# Patient Record
Sex: Male | Born: 1990 | Race: White | Hispanic: No | Marital: Married | Smoking: Never smoker
Health system: Southern US, Community
[De-identification: ages and names within clinical notes are randomized; demographics above are authoritative.]

## PROBLEM LIST (undated history)

## (undated) HISTORY — PX: APPENDECTOMY: SHX54

---

## 2017-06-02 ENCOUNTER — Encounter: Payer: Self-pay | Admitting: Physician Assistant

## 2017-06-02 ENCOUNTER — Encounter (INDEPENDENT_AMBULATORY_CARE_PROVIDER_SITE_OTHER): Payer: Self-pay

## 2017-06-02 ENCOUNTER — Ambulatory Visit: Payer: Commercial Managed Care - PPO | Admitting: Physician Assistant

## 2017-06-02 VITALS — BP 128/65 | HR 79 | Ht 68.5 in | Wt 195.0 lb

## 2017-06-02 DIAGNOSIS — Z3009 Encounter for other general counseling and advice on contraception: Secondary | ICD-10-CM | POA: Diagnosis not present

## 2017-06-02 DIAGNOSIS — F900 Attention-deficit hyperactivity disorder, predominantly inattentive type: Secondary | ICD-10-CM

## 2017-06-02 MED ORDER — AMPHETAMINE-DEXTROAMPHET ER 20 MG PO CP24: 20.0000 mg | ORAL_CAPSULE | ORAL | 0 refills | Status: DC

## 2017-06-02 NOTE — Patient Instructions (Signed)
Vasectomy  A vasectomy is tying (with or without cutting) the tube that collects the sperm from the testicle (vas deferens). The vasectomy blocks the sperm from going through the vas deferens and penis so that during sexual intercourse, the sperm does not go into the vagina. Vasectomy is safe, with very rare complications. It does not affect your sexual desire or performance. A vasectomy does not prevent sexually transmitted diseases.  Because vasectomy is considered permanent, you should not have it done until you are sure you do not want any more children. You and your partner should be in full agreement to have the procedure. Your decision to have a vasectomy should not be made during a stressful situation. This includes loss of a pregnancy, illness, death of a spouse, or divorce. There are other means of contraception that can be used until you are completely sure you want this procedure done.  Tell a health care provider about:  · Any allergies you have.  · All medicines you are taking, including vitamins, herbs, eye drops, creams, and over-the-counter medicines.  · Any problems you or family members have had with anesthetic medicines.  · Any blood disorders you have.  · Any surgeries you have had.  · Any medical conditions you have.  What are the risks?  Generally, vasectomy is a safe procedure. However, as with any procedure, complications can occur. Possible complications include:  · Failure of the procedure to cause infertility. This means you would still be able to get a male pregnant. Even after sterilization has been achieved, there is a 1 in 10,000 chance that the two cut ends may reconnect (recanalization).  · Infection. A germ starts growing in the wound. This can usually be treated with antibiotic medicine(s).  · An allergic reaction to the anesthetic or other medicine given.  · Bleeding. Blood may seep under the skin so that the scrotum and penis  appear to be bruised. Sometimes the scrotum can swell and get the size of a grapefruit. This usually disappears without treatment within a week or two.    What happens before the procedure?  · Do not take aspirin or aspirin-containing products for 7 days prior to your procedure.  · Do not take nonsteroidal anti-inflammatory products for 7 days prior to your procedure.  · You may be instructed to wash with soap before coming in for your procedure.  What happens during the procedure?  · The scrotum is cleaned with bacteria-killing soap, and the health care provider finds the vas deferens.  · Each side of the scrotum is numbed.  · A very small cut (incision) is made, and the vas deferens are pulled out of the scrotum. The vas deferens are then tied off, cut, or may be burned (cauterized) at the ends.  · Sometimes the vas deferens are pulled out from the scrotum through a puncture wound. This is done with a special instrument without an incision.  · The vas deferens are then put back into the scrotum, and the incision or puncture wound is closed. Absorbable suture material that will dissolve and not need to be removed is commonly used.  · After surgery, sperm may still be left in the vas deferens for 1–3 months. Because of this, other means of contraception should be used until your health care provider examines you and finds there are no sperm in your seminal fluid.  What happens after the procedure?  After the procedure, you will be taken to the recovery area. Your   progress will be watched and checked. Once you are awake, stable, and taking fluids well, you will be allowed to go home as long as there are no problems.  This information is not intended to replace advice given to you by your health care provider. Make sure you discuss any questions you have with your health care provider.  Document Released: 08/03/2002 Document Revised: 10/19/2015 Document Reviewed: 11/30/2012   Elsevier Interactive Patient Education © 2018 [employer]

## 2017-06-02 NOTE — Progress Notes (Signed)
   Subjective:    Patient ID: Michael Booker, male    DOB: 01/12/1991, 27 y.o.   MRN: [id number]  HPI  Patient is a 27 year old pleasant male who presents to the clinic to establish care.  He does have a past medical history of attention deficit disorder.  He was on Ritalin and Concerta as a child in middle school and high school.  He switched to Adderall in college.  He has not been on medication since college.  He has seemed always managed to find a way around his issue as an adult.  He now has a more stressful job which requires more concentration.  He was recently started on Wellbutrin to see if that would help with his difficulty concentrating.  That does not seem to help at all.  He also feels like it seems to make his mood change.  He would like to restart a stimulant.  Patient also has 2 children and he would like to have a referral for vasectomy.  .. Active Ambulatory Problems    Diagnosis Date Noted  . No Active Ambulatory Problems   Resolved Ambulatory Problems    Diagnosis Date Noted  . No Resolved Ambulatory Problems   No Additional Past Medical History      Review of Systems  All other systems reviewed and are negative.      Objective:   Physical Exam  Constitutional: He is oriented to person, place, and time. He appears well-developed and well-nourished.  HENT:  Head: Normocephalic and atraumatic.  Eyes: Conjunctivae are normal.  Neck: Normal range of motion. Neck supple.  Cardiovascular: Normal rate, regular rhythm and normal heart sounds.  Pulmonary/Chest: Effort normal and breath sounds normal.  Neurological: He is alert and oriented to person, place, and time.  Psychiatric: He has a normal mood and affect. His behavior is normal.          Assessment & Plan:  [name].[name].Diagnoses and all orders for this visit:  Attention deficit hyperactivity disorder (ADHD), predominantly inattentive type -     amphetamine-dextroamphetamine (ADDERALL XR) 20 MG 24 hr capsule;  Take 1 capsule (20 mg total) by mouth every morning.  Vasectomy evaluation -     Ambulatory referral to Urology   Restarted adderall. Discussed side effects. Follow up in 1 month.  Once we get him on appropriate dose can follow up every 3 months.   Discussed need for fasting labs.

## 2017-07-07 ENCOUNTER — Encounter: Payer: Self-pay | Admitting: Physician Assistant

## 2017-07-07 ENCOUNTER — Ambulatory Visit: Payer: Commercial Managed Care - PPO | Admitting: Physician Assistant

## 2017-07-07 VITALS — BP 129/78 | HR 68 | Ht 68.5 in | Wt 197.0 lb

## 2017-07-07 DIAGNOSIS — F900 Attention-deficit hyperactivity disorder, predominantly inattentive type: Secondary | ICD-10-CM

## 2017-07-07 DIAGNOSIS — R040 Epistaxis: Secondary | ICD-10-CM | POA: Diagnosis not present

## 2017-07-07 MED ORDER — AMPHETAMINE-DEXTROAMPHET ER 30 MG PO CP24: 30.0000 mg | ORAL_CAPSULE | ORAL | 0 refills | Status: DC

## 2017-07-07 MED ORDER — AMPHETAMINE-DEXTROAMPHETAMINE 10 MG PO TABS: ORAL_TABLET | ORAL | 0 refills | Status: DC

## 2017-07-07 NOTE — Progress Notes (Signed)
   Subjective:    Patient ID: Michael Booker, male    DOB: 04/20/1991, 27 y.o.   MRN: [id number]  HPI  Patient is a 27 year old male who presents to the clinic to follow-up on ADHD.  He was recently started on Adderall 20 mg extended release.  He does feel like this has been beneficial but would like to increase.  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  He denies any problems with sleeping, any increase in anxiety, or any problems with eating.  When I did walk into the room he was nursing a left nare nosebleed.  He denies having these often.  Bleeding was well controlled.  No other concerns.  .. Active Ambulatory Problems    Diagnosis Date Noted  . Attention deficit hyperactivity disorder (ADHD), predominantly inattentive type 06/02/2017   Resolved Ambulatory Problems    Diagnosis Date Noted  . No Resolved Ambulatory Problems   No Additional Past Medical History      Review of Systems  All other systems reviewed and are negative.      Objective:   Physical Exam  Constitutional: He is oriented to person, place, and time. He appears well-developed and well-nourished.  HENT:  Head: Normocephalic and atraumatic.  Right Ear: External ear normal.  Left Ear: External ear normal.  Dried blood left nare.  Cardiovascular: Normal rate, regular rhythm and normal heart sounds.  Pulmonary/Chest: Effort normal and breath sounds normal.  Neurological: He is alert and oriented to person, place, and time.  Psychiatric: He has a normal mood and affect. His behavior is normal.          Assessment & Plan:   [name].[name].Aurther Lofterry was seen today for adhd.  Diagnoses and all orders for this visit:  Attention deficit hyperactivity disorder (ADHD), predominantly inattentive type -     amphetamine-dextroamphetamine (ADDERALL XR) 30 MG 24 hr capsule; Take 1 capsule (30 mg total) by mouth every morning. -     amphetamine-dextroamphetamine  (ADDERALL) 10 MG tablet; Take one tablet at 2pm.  Left-sided nosebleed  Other orders -     Discontinue: amphetamine-dextroamphetamine (ADDERALL XR) 30 MG 24 hr capsule; Take 1 capsule (30 mg total) by mouth every morning. -     Discontinue: amphetamine-dextroamphetamine (ADDERALL) 10 MG tablet; Take one tablet at 2pm. -     Discontinue: amphetamine-dextroamphetamine (ADDERALL XR) 30 MG 24 hr capsule; Take 1 capsule (30 mg total) by mouth every morning. -     Discontinue: amphetamine-dextroamphetamine (ADDERALL) 10 MG tablet; Take one tablet at 2pm.     Increased adderall to 30mg  XR added 10mg  immediate release. Follow up in 3 months. Vitals look great. No side effects.   Discussed humidifer in bedroom to sleep with. Ayr nasal gel can help prevent as well. Avoid nasal sprays.  Afrin only if can't get bleeding to stop.

## 2017-09-01 ENCOUNTER — Ambulatory Visit: Payer: Commercial Managed Care - PPO | Admitting: Physician Assistant

## 2017-09-03 ENCOUNTER — Encounter: Payer: Self-pay | Admitting: Physician Assistant

## 2017-09-03 ENCOUNTER — Ambulatory Visit: Payer: Commercial Managed Care - PPO | Admitting: Physician Assistant

## 2017-09-03 VITALS — BP 116/65 | HR 77 | Ht 68.5 in | Wt 188.0 lb

## 2017-09-03 DIAGNOSIS — F900 Attention-deficit hyperactivity disorder, predominantly inattentive type: Secondary | ICD-10-CM

## 2017-09-03 MED ORDER — AMPHETAMINE-DEXTROAMPHETAMINE 10 MG PO TABS: 10.0000 mg | ORAL_TABLET | Freq: Two times a day (BID) | ORAL | 0 refills | Status: DC

## 2017-09-03 MED ORDER — AMPHETAMINE-DEXTROAMPHET ER 30 MG PO CP24: 30.0000 mg | ORAL_CAPSULE | ORAL | 0 refills | Status: DC

## 2017-09-03 MED ORDER — AMPHETAMINE-DEXTROAMPHETAMINE 10 MG PO TABS
ORAL_TABLET | ORAL | 0 refills | Status: DC
Start: 2017-09-03 — End: 2017-12-03

## 2017-09-03 NOTE — Progress Notes (Deleted)
   Subjective:    Patient ID: Michael Booker, male    DOB: 03/24/1991, 27 y.o.   MRN: [id number]  HPI  6   Review of Systems     Objective:   Physical Exam        Assessment & Plan:    [name].[name].Michael Booker was seen today for adhd.  Diagnoses and all orders for this visit:  Attention deficit hyperactivity disorder (ADHD), predominantly inattentive type -     amphetamine-dextroamphetamine (ADDERALL XR) 30 MG 24 hr capsule; Take 1 capsule (30 mg total) by mouth every morning. -     amphetamine-dextroamphetamine (ADDERALL) 10 MG tablet; Take one tablet at 2pm.  Other orders -     amphetamine-dextroamphetamine (ADDERALL) 10 MG tablet; Take 1 tablet (10 mg total) by mouth 2 (two) times daily. -     amphetamine-dextroamphetamine (ADDERALL XR) 30 MG 24 hr capsule; Take 1 capsule (30 mg total) by mouth every morning. -     amphetamine-dextroamphetamine (ADDERALL) 10 MG tablet; Take 1 tablet (10 mg total) by mouth 2 (two) times daily. -     amphetamine-dextroamphetamine (ADDERALL XR) 30 MG 24 hr capsule; Take 1 capsule (30 mg total) by mouth every morning.

## 2017-09-03 NOTE — Progress Notes (Signed)
   Subjective:    Patient ID: Michael Booker, male    DOB: 03/09/1991, 27 y.o.   MRN: [id number]  HPI Michael Booker presents today for ADHD medication refill. He states that he is going well on his medication and is not requesting any changes. He denies any insomnia, appetite changes, chest pain, palpitations, headaches. He does state that sometimes he has been going to sleep a little later but is able to sleep. He has started taking his afternoon dose of adderall a little earlier than he was originally.  .. Active Ambulatory Problems    Diagnosis Date Noted  . Attention deficit hyperactivity disorder (ADHD), predominantly inattentive type 06/02/2017   Resolved Ambulatory Problems    Diagnosis Date Noted  . No Resolved Ambulatory Problems   No Additional Past Medical History      Review of Systems  Constitutional: Negative for activity change and appetite change.  Respiratory: Negative for cough, chest tightness and shortness of breath.   Cardiovascular: Negative for chest pain and palpitations.  Gastrointestinal: Negative for abdominal pain and vomiting.  Neurological: Negative for dizziness, light-headedness and headaches.  Psychiatric/Behavioral: Positive for sleep disturbance.       Objective:   Physical Exam  Constitutional: He is oriented to person, place, and time. He appears well-developed and well-nourished.  HENT:  Head: Normocephalic and atraumatic.  Eyes: Conjunctivae are normal.  Neck: Normal range of motion. Neck supple.  Cardiovascular: Normal rate, regular rhythm and normal heart sounds.  Pulmonary/Chest: Breath sounds normal. No respiratory distress. He has no wheezes.  Musculoskeletal: Normal range of motion.  Neurological: He is alert and oriented to person, place, and time.  Skin: Skin is warm and dry.  Psychiatric: He has a normal mood and affect. His behavior is normal.          Assessment & Plan:  [name].[name].Michael Booker was seen today for adhd.  Diagnoses and all  orders for this visit:  Attention deficit hyperactivity disorder (ADHD), predominantly inattentive type -     amphetamine-dextroamphetamine (ADDERALL XR) 30 MG 24 hr capsule; Take 1 capsule (30 mg total) by mouth every morning. -     amphetamine-dextroamphetamine (ADDERALL) 10 MG tablet; Take one tablet at 2pm. -     amphetamine-dextroamphetamine (ADDERALL) 10 MG tablet; Take 1 tablet (10 mg total) by mouth 2 (two) times daily. -     amphetamine-dextroamphetamine (ADDERALL XR) 30 MG 24 hr capsule; Take 1 capsule (30 mg total) by mouth every morning. -     amphetamine-dextroamphetamine (ADDERALL) 10 MG tablet; Take 1 tablet (10 mg total) by mouth 2 (two) times daily. -     amphetamine-dextroamphetamine (ADDERALL XR) 30 MG 24 hr capsule; Take 1 capsule (30 mg total) by mouth every morning.   Refills sent for 90 days. Vitals look great. Functioning well. I did discuss sleeping troubles and how stimulants can exacerbate this. Will continue to monitor. Make sure not taking afternoon dose too late.

## 2017-10-06 ENCOUNTER — Ambulatory Visit: Payer: Commercial Managed Care - PPO | Admitting: Physician Assistant

## 2017-12-03 ENCOUNTER — Ambulatory Visit: Payer: Commercial Managed Care - PPO | Admitting: Physician Assistant

## 2017-12-03 ENCOUNTER — Encounter: Payer: Self-pay | Admitting: Physician Assistant

## 2017-12-03 DIAGNOSIS — F900 Attention-deficit hyperactivity disorder, predominantly inattentive type: Secondary | ICD-10-CM

## 2017-12-03 MED ORDER — AMPHETAMINE-DEXTROAMPHETAMINE 10 MG PO TABS: ORAL_TABLET | ORAL | 0 refills | Status: DC

## 2017-12-03 MED ORDER — AMPHETAMINE-DEXTROAMPHET ER 30 MG PO CP24
30.0000 mg | ORAL_CAPSULE | ORAL | 0 refills | Status: DC
Start: 2018-01-03 — End: 2018-03-25

## 2017-12-03 MED ORDER — AMPHETAMINE-DEXTROAMPHET ER 30 MG PO CP24: 30.0000 mg | ORAL_CAPSULE | ORAL | 0 refills | Status: DC

## 2017-12-03 NOTE — Progress Notes (Signed)
   Subjective:    Patient ID: Michael Booker, male    DOB: 04/21/1991, 27 y.o.   MRN: [id number]  HPI Pt is a 27 yo male with ADHD who presents to the clinic for 3 month follow up.  Pt is doing great. He has no complaints. He denies any trouble sleeping, increase in anxiety, palpitations or CP. He is doing well at work. He does not take every day. He actually does not need a refill today.   .. Active Ambulatory Problems    Diagnosis Date Noted  . Attention deficit hyperactivity disorder (ADHD), predominantly inattentive type 06/02/2017   Resolved Ambulatory Problems    Diagnosis Date Noted  . No Resolved Ambulatory Problems   No Additional Past Medical History      Review of Systems  All other systems reviewed and are negative.      Objective:   Physical Exam  Constitutional: He is oriented to person, place, and time. He appears well-developed and well-nourished.  Cardiovascular: Normal rate and regular rhythm.  Pulmonary/Chest: Effort normal and breath sounds normal.  Neurological: He is alert and oriented to person, place, and time.  Psychiatric: He has a normal mood and affect. His behavior is normal.          Assessment & Plan:  [name].[name].Diagnoses and all orders for this visit:  Attention deficit hyperactivity disorder (ADHD), predominantly inattentive type -     amphetamine-dextroamphetamine (ADDERALL XR) 30 MG 24 hr capsule; Take 1 capsule (30 mg total) by mouth every morning. -     amphetamine-dextroamphetamine (ADDERALL) 10 MG tablet; Take one tablet at 2pm as needed. -     amphetamine-dextroamphetamine (ADDERALL) 10 MG tablet; Take one tablet at 2pm as needed. -     amphetamine-dextroamphetamine (ADDERALL) 10 MG tablet; Take one tablet at 2pm as needed. -     amphetamine-dextroamphetamine (ADDERALL XR) 30 MG 24 hr capsule; Take 1 capsule (30 mg total) by mouth every morning. -     amphetamine-dextroamphetamine (ADDERALL XR) 30 MG 24 hr capsule; Take 1 capsule (30 mg  total) by mouth every morning.   Refilled for 3 months.

## 2018-03-25 ENCOUNTER — Ambulatory Visit: Payer: Commercial Managed Care - PPO | Admitting: Physician Assistant

## 2018-03-25 ENCOUNTER — Encounter: Payer: Self-pay | Admitting: Physician Assistant

## 2018-03-25 VITALS — BP 127/80 | HR 84 | Ht 68.5 in | Wt 196.0 lb

## 2018-03-25 DIAGNOSIS — F419 Anxiety disorder, unspecified: Secondary | ICD-10-CM | POA: Diagnosis not present

## 2018-03-25 DIAGNOSIS — F900 Attention-deficit hyperactivity disorder, predominantly inattentive type: Secondary | ICD-10-CM | POA: Diagnosis not present

## 2018-03-25 MED ORDER — ESCITALOPRAM OXALATE 5 MG PO TABS: 5.0000 mg | ORAL_TABLET | Freq: Every day | ORAL | 0 refills | Status: DC

## 2018-03-25 MED ORDER — AMPHETAMINE-DEXTROAMPHETAMINE 10 MG PO TABS: ORAL_TABLET | ORAL | 0 refills | Status: DC

## 2018-03-25 MED ORDER — AMPHETAMINE-DEXTROAMPHET ER 30 MG PO CP24: 30.0000 mg | ORAL_CAPSULE | ORAL | 0 refills | Status: DC

## 2018-03-25 NOTE — Progress Notes (Signed)
Subjective:    Patient ID: Michael Booker, male    DOB: Mar 07, 1991, 27 y.o.   MRN: [id number]  HPI Patient is a 27 year old male who presents to the clinic for ADHD follow-up.  He is doing fairly well on current dose.  He denies any problems sleeping, palpitations, chest pain, headaches or vision changes.  He is doing well at work and able to stay focused.  He is very productive.  He does report some persistent anxiety.  He denies any feelings of hopelessness or helplessness.  He just feels like he worries a lot about different things.  He also feels like he could be easily irritated.  He denies any suicidal thoughts or homicidal lobulations. He is interested in trying something. He denies any panic attacks.   .. Active Ambulatory Problems    Diagnosis Date Noted  . Attention deficit hyperactivity disorder (ADHD), predominantly inattentive type 06/02/2017   Resolved Ambulatory Problems    Diagnosis Date Noted  . No Resolved Ambulatory Problems   No Additional Past Medical History       Review of Systems See HPI>     Objective:   Physical Exam  Constitutional: He is oriented to person, place, and time. He appears well-developed and well-nourished.  HENT:  Head: Normocephalic and atraumatic.  Cardiovascular: Normal rate and regular rhythm.  Pulmonary/Chest: Effort normal and breath sounds normal.  Neurological: He is alert and oriented to person, place, and time.  Psychiatric: He has a normal mood and affect. His behavior is normal.          Assessment & Plan:  [name] KitchenMarland KitchenDiagnoses and all orders for this visit:  Attention deficit hyperactivity disorder (ADHD), predominantly inattentive type -     amphetamine-dextroamphetamine (ADDERALL XR) 30 MG 24 hr capsule; Take 1 capsule (30 mg total) by mouth every morning. -     amphetamine-dextroamphetamine (ADDERALL XR) 30 MG 24 hr capsule; Take 1 capsule (30 mg total) by mouth every morning. -     amphetamine-dextroamphetamine (ADDERALL  XR) 30 MG 24 hr capsule; Take 1 capsule (30 mg total) by mouth every morning. -     amphetamine-dextroamphetamine (ADDERALL) 10 MG tablet; Take one tablet at 2pm as needed. -     amphetamine-dextroamphetamine (ADDERALL) 10 MG tablet; Take one tablet at 2pm as needed. -     amphetamine-dextroamphetamine (ADDERALL) 10 MG tablet; Take one tablet at 2pm as needed.  Anxiety -     escitalopram (LEXAPRO) 5 MG tablet; Take 1 tablet (5 mg total) by mouth daily.   .. GAD 7 : Generalized Anxiety Score 03/25/2018 12/03/2017  Nervous, Anxious, on Edge 1 0  Control/stop worrying 1 0  Worry too much - different things 1 1  Trouble relaxing 1 1  Restless 1 0  Easily annoyed or irritable 2 1  Afraid - awful might happen 1 0  Total GAD 7 Score 8 3  Anxiety Difficulty Somewhat difficult Not difficult at all    .[name] Depression screen [facility] 2/9 03/25/2018 12/03/2017 06/02/2017  Decreased Interest 1 1 2   Down, Depressed, Hopeless 0 1 1  PHQ - 2 Score 1 2 3   Altered sleeping 0 1 1  Tired, decreased energy 0 1 1  Change in appetite 0 1 1  Feeling bad or failure about yourself  0 0 1  Trouble concentrating 0 1 1  Moving slowly or fidgety/restless 0 0 0  Suicidal thoughts 0 0 0  PHQ-9 Score 1 6 8   Difficult doing work/chores Not difficult  at all Not difficult at all -   Refilled adderall for  3months.   Start lexapro 5mg  daily. Discussed side effects. Stop if worsening depression or anxiety. Follow up in 3 months.

## 2018-06-23 ENCOUNTER — Encounter: Payer: Self-pay | Admitting: Physician Assistant

## 2018-06-23 ENCOUNTER — Ambulatory Visit (INDEPENDENT_AMBULATORY_CARE_PROVIDER_SITE_OTHER): Payer: Commercial Managed Care - PPO | Admitting: Physician Assistant

## 2018-06-23 DIAGNOSIS — F900 Attention-deficit hyperactivity disorder, predominantly inattentive type: Secondary | ICD-10-CM

## 2018-06-23 MED ORDER — AMPHETAMINE-DEXTROAMPHETAMINE 10 MG PO TABS: ORAL_TABLET | ORAL | 0 refills | Status: DC

## 2018-06-23 MED ORDER — AMPHETAMINE-DEXTROAMPHET ER 30 MG PO CP24: 30.0000 mg | ORAL_CAPSULE | ORAL | 0 refills | Status: DC

## 2018-06-23 NOTE — Progress Notes (Signed)
Acute Office Visit  Subjective:    Patient ID: Michael Booker, male    DOB: [birth year], 28 y.o.   MRN: [id number]  Chief Complaint  Patient presents with  . Medication Refill    HPI Patient is in today for ADHD medication refill.  Patient is doing well.  Is well controlled on his current dosage.  He denies any problems sleeping or any increase in anxiety or palpitations.  No problems or concerns.  He did stop taking his Lexapro.  He just did not like the way he felt on it.  Denies any one symptom just did not like the way he felt in his head.  Overall he feels like his mood is well controlled.  No past medical history on file.  Past Surgical History:  Procedure Laterality Date  . APPENDECTOMY      No family history on file.  Social History   Socioeconomic History  . Marital status: Married    Spouse name: Not on file  . Number of children: Not on file  . Years of education: Not on file  . Highest education level: Not on file  Occupational History  . Not on file  Social Needs  . Financial resource strain: Not on file  . Food insecurity:    Worry: Not on file    Inability: Not on file  . Transportation needs:    Medical: Not on file    Non-medical: Not on file  Tobacco Use  . Smoking status: Never Smoker  . Smokeless tobacco: Never Used  Substance and Sexual Activity  . Alcohol use: Not on file  . Drug use: No  . Sexual activity: Yes  Lifestyle  . Physical activity:    Days per week: Not on file    Minutes per session: Not on file  . Stress: Not on file  Relationships  . Social connections:    Talks on phone: Not on file    Gets together: Not on file    Attends religious service: Not on file    Active member of club or organization: Not on file    Attends meetings of clubs or organizations: Not on file    Relationship status: Not on file  . Intimate partner violence:    Fear of current or ex partner: Not on file    Emotionally abused: Not on file   Physically abused: Not on file    Forced sexual activity: Not on file  Other Topics Concern  . Not on file  Social History Narrative  . Not on file    Outpatient Medications Prior to Visit  Medication Sig Dispense Refill  . amphetamine-dextroamphetamine (ADDERALL XR) 30 MG 24 hr capsule Take 1 capsule (30 mg total) by mouth every morning. 30 capsule 0  . amphetamine-dextroamphetamine (ADDERALL XR) 30 MG 24 hr capsule Take 1 capsule (30 mg total) by mouth every morning. 30 capsule 0  . amphetamine-dextroamphetamine (ADDERALL XR) 30 MG 24 hr capsule Take 1 capsule (30 mg total) by mouth every morning. 30 capsule 0  . amphetamine-dextroamphetamine (ADDERALL) 10 MG tablet Take one tablet at 2pm as needed. 30 tablet 0  . amphetamine-dextroamphetamine (ADDERALL) 10 MG tablet Take one tablet at 2pm as needed. 30 tablet 0  . amphetamine-dextroamphetamine (ADDERALL) 10 MG tablet Take one tablet at 2pm as needed. 30 tablet 0  . escitalopram (LEXAPRO) 5 MG tablet Take 1 tablet (5 mg total) by mouth daily. (Patient not taking: Reported on 06/23/2018) 30 tablet 0  No facility-administered medications prior to visit.     No Known Allergies  Review of Systems  All other systems reviewed and are negative.      Objective:    Physical Exam  Constitutional: He is oriented to person, place, and time. He appears well-developed and well-nourished.  HENT:  Head: Normocephalic and atraumatic.  Cardiovascular: Normal rate and regular rhythm.  Pulmonary/Chest: Effort normal and breath sounds normal.  Neurological: He is alert and oriented to person, place, and time.  Psychiatric: He has a normal mood and affect. His behavior is normal.    BP 122/75   Pulse 91   Ht 5' 8.5" (1.74 m)   Wt 200 lb (90.7 kg)   BMI 29.97 kg/m  Wt Readings from Last 3 Encounters:  06/23/18 200 lb (90.7 kg)  03/25/18 196 lb (88.9 kg)  12/03/17 194 lb (88 kg)    There are no preventive care reminders to display for  this patient.  There are no preventive care reminders to display for this patient.   No results found for: TSH No results found for: WBC, HGB, HCT, MCV, PLT No results found for: NA, K, CHLORIDE, CO2, GLUCOSE, BUN, CREATININE, BILITOT, ALKPHOS, AST, ALT, PROT, ALBUMIN, CALCIUM, ANIONGAP, EGFR, GFR No results found for: CHOL No results found for: HDL No results found for: LDLCALC No results found for: TRIG No results found for: CHOLHDL No results found for: HGBA1C     Assessment & Plan:   Problem List Items Addressed This Visit      Unprioritized   Attention deficit hyperactivity disorder (ADHD), predominantly inattentive type   Relevant Medications   amphetamine-dextroamphetamine (ADDERALL XR) 30 MG 24 hr capsule (Start on 08/22/2018)   amphetamine-dextroamphetamine (ADDERALL XR) 30 MG 24 hr capsule (Start on 07/24/2018)   amphetamine-dextroamphetamine (ADDERALL XR) 30 MG 24 hr capsule   amphetamine-dextroamphetamine (ADDERALL) 10 MG tablet (Start on 08/22/2018)   amphetamine-dextroamphetamine (ADDERALL) 10 MG tablet (Start on 07/24/2018)   amphetamine-dextroamphetamine (ADDERALL) 10 MG tablet       Meds ordered this encounter  Medications  . amphetamine-dextroamphetamine (ADDERALL XR) 30 MG 24 hr capsule    Sig: Take 1 capsule (30 mg total) by mouth every morning.    Dispense:  30 capsule    Refill:  0    Order Specific Question:   Supervising Provider    Answer:   Beatrice Lecher D [[id number]]  . amphetamine-dextroamphetamine (ADDERALL XR) 30 MG 24 hr capsule    Sig: Take 1 capsule (30 mg total) by mouth every morning.    Dispense:  30 capsule    Refill:  0    Order Specific Question:   Supervising Provider    Answer:   Beatrice Lecher D [[id number]]  . amphetamine-dextroamphetamine (ADDERALL XR) 30 MG 24 hr capsule    Sig: Take 1 capsule (30 mg total) by mouth every morning.    Dispense:  30 capsule    Refill:  0    Order Specific Question:   Supervising Provider     Answer:   Beatrice Lecher D [[id number]]  . amphetamine-dextroamphetamine (ADDERALL) 10 MG tablet    Sig: Take one tablet at 2pm as needed.    Dispense:  30 tablet    Refill:  0    Order Specific Question:   Supervising Provider    Answer:   Beatrice Lecher D [[id number]]  . amphetamine-dextroamphetamine (ADDERALL) 10 MG tablet    Sig: Take one tablet at 2pm as needed.    Dispense:  30 tablet    Refill:  0    Order Specific Question:   Supervising Provider    Answer:   Beatrice Lecher D [[id number]]  . amphetamine-dextroamphetamine (ADDERALL) 10 MG tablet    Sig: Take one tablet at 2pm as needed.    Dispense:  30 tablet    Refill:  0    Order Specific Question:   Supervising Provider    Answer:   Beatrice Lecher D [[id number]]    Follow up in 3 months.  Iran Planas, PA-C

## 2018-06-24 ENCOUNTER — Ambulatory Visit: Payer: Commercial Managed Care - PPO | Admitting: Physician Assistant

## 2018-07-06 ENCOUNTER — Telehealth: Payer: Self-pay | Admitting: Physician Assistant

## 2018-07-06 NOTE — Telephone Encounter (Signed)
Pt called and left message that he needed someone to contact pharmacy, Attempted to call patient in regards to what was needed, no answer , and no VM available.

## 2018-07-06 NOTE — Telephone Encounter (Signed)
Patient is aware that the tablets are approved and we are waiting on insurance to respond about the capsules.   Received fax from [employer] that Adderall Capsules requires a PA. Information has been sent to the insurance company. Awaiting determination.

## 2018-07-06 NOTE — Telephone Encounter (Signed)
Approvedtoday (Adderall Tablets) Request Reference Number: [id number]. AMPHET/DEXTR TAB 10MG  is approved through 07/07/2019. For further questions, call [phone]. Pharmacy aware.

## 2018-07-07 ENCOUNTER — Telehealth: Payer: Self-pay

## 2018-07-07 NOTE — Telephone Encounter (Signed)
Pt should be able to pick up Michael Booker. Please let him know.

## 2018-07-07 NOTE — Telephone Encounter (Signed)
Patient has been notified. Thanks

## 2018-07-07 NOTE — Telephone Encounter (Signed)
See other note. Its approved.  

## 2018-07-07 NOTE — Telephone Encounter (Signed)
Patient called today saying that his insurance company is requiring a PA to be done before he can have his Adderall. Just wanted to let you know. Thanks!

## 2018-07-07 NOTE — Telephone Encounter (Signed)
Received a fax from [employer] that Adderall XR has been approved through 07/07/2019. Pharmacy aware and forms sent to scan.

## 2018-09-22 ENCOUNTER — Ambulatory Visit (INDEPENDENT_AMBULATORY_CARE_PROVIDER_SITE_OTHER): Payer: Commercial Managed Care - PPO | Admitting: Physician Assistant

## 2018-09-22 DIAGNOSIS — Z5329 Procedure and treatment not carried out because of patient's decision for other reasons: Secondary | ICD-10-CM

## 2018-09-23 NOTE — Progress Notes (Signed)
No show

## 2018-09-28 ENCOUNTER — Encounter: Payer: Self-pay | Admitting: Physician Assistant

## 2018-09-28 ENCOUNTER — Ambulatory Visit (INDEPENDENT_AMBULATORY_CARE_PROVIDER_SITE_OTHER): Payer: Commercial Managed Care - PPO | Admitting: Physician Assistant

## 2018-09-28 DIAGNOSIS — F900 Attention-deficit hyperactivity disorder, predominantly inattentive type: Secondary | ICD-10-CM | POA: Diagnosis not present

## 2018-09-28 MED ORDER — AMPHETAMINE-DEXTROAMPHET ER 30 MG PO CP24: 30.0000 mg | ORAL_CAPSULE | ORAL | 0 refills | Status: DC

## 2018-09-28 NOTE — Progress Notes (Signed)
   Subjective:    Patient ID: Michael Booker, male    DOB: Mar 21, 1991, 28 y.o.   MRN: [id number]  HPI Pt is a 28 yo male with ADHD who presents to the clinic for refills. Pt is doing great with no concerns. No CP, palpitations, headaches, anxiety, or insomnia. Work production is great. adderall is working well.   .. Active Ambulatory Problems    Diagnosis Date Noted  . Attention deficit hyperactivity disorder (ADHD), predominantly inattentive type 06/02/2017   Resolved Ambulatory Problems    Diagnosis Date Noted  . No Resolved Ambulatory Problems   No Additional Past Medical History      Review of Systems  All other systems reviewed and are negative.      Objective:   Physical Exam Vitals signs reviewed.  Constitutional:      Appearance: Normal appearance.  HENT:     Head: Normocephalic.  Cardiovascular:     Rate and Rhythm: Normal rate and regular rhythm.     Pulses: Normal pulses.  Pulmonary:     Effort: Pulmonary effort is normal.  Neurological:     General: No focal deficit present.     Mental Status: He is alert and oriented to person, place, and time.  Psychiatric:        Mood and Affect: Mood normal.           Assessment & Plan:  [name] KitchenMarland KitchenTerin was seen today for adhd.  Diagnoses and all orders for this visit:  Attention deficit hyperactivity disorder (ADHD), predominantly inattentive type -     amphetamine-dextroamphetamine (ADDERALL XR) 30 MG 24 hr capsule; Take 1 capsule (30 mg total) by mouth every morning. -     amphetamine-dextroamphetamine (ADDERALL XR) 30 MG 24 hr capsule; Take 1 capsule (30 mg total) by mouth every morning. -     amphetamine-dextroamphetamine (ADDERALL XR) 30 MG 24 hr capsule; Take 1 capsule (30 mg total) by mouth every morning.   Refilled for 3 months.

## 2018-10-26 ENCOUNTER — Telehealth: Payer: Self-pay | Admitting: Neurology

## 2018-10-26 NOTE — Telephone Encounter (Signed)
Patient left vm stating he needs refill of controlled substance.  I called patient and left vm letting him know Adderall was sent for 3 months, but can not use RX number to refill, he will have to speak to pharmacy. TO call back with any questions.

## 2018-11-09 ENCOUNTER — Telehealth: Payer: Self-pay

## 2018-11-09 DIAGNOSIS — F900 Attention-deficit hyperactivity disorder, predominantly inattentive type: Secondary | ICD-10-CM

## 2018-11-09 MED ORDER — AMPHETAMINE-DEXTROAMPHETAMINE 10 MG PO TABS: ORAL_TABLET | ORAL | 0 refills | Status: DC

## 2018-11-09 NOTE — Telephone Encounter (Signed)
Pt advised.

## 2018-11-09 NOTE — Telephone Encounter (Signed)
Sent!

## 2018-11-09 NOTE — Telephone Encounter (Signed)
Patient requesting RX be sent for Adderall 10mg .   RX last sent on 08/22/18 for #30, 1 tab at 2 PM PRN  Please send RF if appropriate

## 2018-11-18 ENCOUNTER — Encounter: Payer: Self-pay | Admitting: Physician Assistant

## 2018-11-20 ENCOUNTER — Encounter: Payer: Self-pay | Admitting: Physician Assistant

## 2018-11-20 DIAGNOSIS — J45909 Unspecified asthma, uncomplicated: Secondary | ICD-10-CM | POA: Insufficient documentation

## 2018-12-29 ENCOUNTER — Ambulatory Visit: Payer: Commercial Managed Care - PPO | Admitting: Physician Assistant

## 2018-12-29 ENCOUNTER — Encounter: Payer: Self-pay | Admitting: Physician Assistant

## 2018-12-29 ENCOUNTER — Other Ambulatory Visit: Payer: Self-pay

## 2018-12-29 DIAGNOSIS — F900 Attention-deficit hyperactivity disorder, predominantly inattentive type: Secondary | ICD-10-CM | POA: Diagnosis not present

## 2018-12-29 MED ORDER — AMPHETAMINE-DEXTROAMPHET ER 30 MG PO CP24: 30.0000 mg | ORAL_CAPSULE | ORAL | 0 refills | Status: DC

## 2018-12-29 MED ORDER — AMPHETAMINE-DEXTROAMPHETAMINE 10 MG PO TABS: ORAL_TABLET | ORAL | 0 refills | Status: DC

## 2018-12-29 MED ORDER — AMPHETAMINE-DEXTROAMPHETAMINE 10 MG PO TABS: 10.0000 mg | ORAL_TABLET | Freq: Every day | ORAL | 0 refills | Status: DC

## 2018-12-29 NOTE — Progress Notes (Signed)
   Subjective:    Patient ID: Tiara Bartoli, male    DOB: [birth year], 28 y.o.   MRN: [id number]  HPI Pt is a 28 yo male with ADHD who presents to the clinic for 3 month follow up. He is doing well on adderall with no concerns. Pt denies any insomnia, anxiety, palpitations, headaches. He is doing well at work and no problems.   .. Active Ambulatory Problems    Diagnosis Date Noted  . Attention deficit hyperactivity disorder (ADHD), predominantly inattentive type 06/02/2017  . Childhood asthma 11/20/2018   Resolved Ambulatory Problems    Diagnosis Date Noted  . No Resolved Ambulatory Problems   No Additional Past Medical History      Review of Systems See HPI>     Objective:   Physical Exam Vitals signs reviewed.  Constitutional:      Appearance: Normal appearance.  HENT:     Head: Normocephalic.  Cardiovascular:     Rate and Rhythm: Normal rate and regular rhythm.     Pulses: Normal pulses.  Pulmonary:     Effort: Pulmonary effort is normal.     Breath sounds: Normal breath sounds.  Neurological:     General: No focal deficit present.     Mental Status: He is alert and oriented to person, place, and time.  Psychiatric:        Mood and Affect: Mood normal.        Behavior: Behavior normal.           Assessment & Plan:  [name] KitchenMarland KitchenBlaze was seen today for adhd.  Diagnoses and all orders for this visit:  Attention deficit hyperactivity disorder (ADHD), predominantly inattentive type -     amphetamine-dextroamphetamine (ADDERALL) 10 MG tablet; Take 1 tablet (10 mg total) by mouth daily. At 2 pm PRN -     amphetamine-dextroamphetamine (ADDERALL) 10 MG tablet; Take 1 tablet (10 mg total) by mouth daily. At 2 pm PRN -     amphetamine-dextroamphetamine (ADDERALL) 10 MG tablet; Take one tablet at 2pm as needed. -     amphetamine-dextroamphetamine (ADDERALL XR) 30 MG 24 hr capsule; Take 1 capsule (30 mg total) by mouth every morning. -     amphetamine-dextroamphetamine (ADDERALL  XR) 30 MG 24 hr capsule; Take 1 capsule (30 mg total) by mouth every morning. -     amphetamine-dextroamphetamine (ADDERALL XR) 30 MG 24 hr capsule; Take 1 capsule (30 mg total) by mouth every morning.   Refilled for 3 months.

## 2019-01-07 ENCOUNTER — Other Ambulatory Visit: Payer: Self-pay

## 2019-01-07 ENCOUNTER — Encounter: Payer: Self-pay | Admitting: Family Medicine

## 2019-01-07 ENCOUNTER — Ambulatory Visit (INDEPENDENT_AMBULATORY_CARE_PROVIDER_SITE_OTHER): Payer: Commercial Managed Care - PPO

## 2019-01-07 ENCOUNTER — Ambulatory Visit: Payer: Commercial Managed Care - PPO | Admitting: Family Medicine

## 2019-01-07 VITALS — BP 139/92 | HR 74 | Temp 97.6°F | Wt 198.0 lb

## 2019-01-07 DIAGNOSIS — R079 Chest pain, unspecified: Secondary | ICD-10-CM

## 2019-01-07 DIAGNOSIS — M25512 Pain in left shoulder: Secondary | ICD-10-CM | POA: Diagnosis not present

## 2019-01-07 DIAGNOSIS — R0789 Other chest pain: Secondary | ICD-10-CM

## 2019-01-07 MED ORDER — TRAMADOL HCL 50 MG PO TABS: 50.0000 mg | ORAL_TABLET | Freq: Three times a day (TID) | ORAL | 0 refills | Status: DC | PRN

## 2019-01-07 MED ORDER — CYCLOBENZAPRINE HCL 10 MG PO TABS: 10.0000 mg | ORAL_TABLET | Freq: Three times a day (TID) | ORAL | 3 refills | Status: DC | PRN

## 2019-01-07 NOTE — Progress Notes (Signed)
Michael Booker is a 28 y.o. male who [facility] today for chest pain after falling off his porch.  Chest Pain: Patient fell from the railing on his porch on 01/02/2019. He fell at least 6 feet.  He landed on his chest wall.  Reports that he was unconscious for a period of time. Since the injury, he has had pain across his anterior chest and ribs. Describes pain as pleuritic. Pain keeps him up at night. He has tried a muscle relaxant and NSAIDs with minimal relief. Denies SOB, exercise intolerance, heart palpitations.  Loss of Consciousness: Immediately after the fall patient was told that he had some loss of consciousness.  His wife attended to him and was able to revive him quickly.  In the interim he feels fine with no fogginess confusion headache lethargy loss of function.  He denies any significant neurological symptoms.  He denies any bruising to his head or scalp.  Left Shoulder Pain: Reports pain in left shoulder that is making him favor the arm. He has pain with abduction above 90 degrees.  Pain is located primarily in the lateral upper arm.  Worse with abduction and reaching back and at bedtime.  Symptoms are improving.  He denies any radiating pain weakness or numbness distally.    ROS:  As above  Exam:  BP (!) 139/92   Pulse 74   Temp 97.6 F (36.4 C) (Oral)   Wt 198 lb (89.8 kg)   BMI 30.11 kg/m  Wt Readings from Last 5 Encounters:  01/07/19 198 lb (89.8 kg)  12/29/18 204 lb (92.5 kg)  09/28/18 202 lb (91.6 kg)  06/23/18 200 lb (90.7 kg)  03/25/18 196 lb (88.9 kg)   General: Well Developed, well nourished, and in no acute distress.  Neuro/Psych: Alert and oriented x3, extra-ocular muscles intact, able to move all 4 extremities, sensation grossly intact.  Normal coordination balance gait.  Normal speech and thought process. Skin: Warm and dry, no rashes noted.  Respiratory: Not using accessory muscles, speaking in  full sentences, trachea midline.  Clear to auscultation bilaterally. Cardiovascular: Pulses palpable, no extremity edema.  Regular rate and rhythm no murmurs rubs or gallops Abdomen: Does not appear distended. MSK: Anterior Chest: Minimal bruising. No gross deformities. Tender to Palpation over mid-sternum.  Nontender over lateral ribs.  No palpable step-offs.  No subcutaneous crackles.  LShoulder: Normal appearing.  Normal range of motion pain with abduction above 90 degrees. Positive empty can.intact strength.  Juanetta GoslingHawkins, and Neer tests.  Pulses cap refill and sensation intact distal bilateral extremities.[name].  C-spine: Nontender to spinal midline normal cervical motion.  Lab and Radiology Results No results found for this or any previous visit (from the past 72 hour(s)). Dg Chest 2 View  Result Date: 01/07/2019 CLINICAL DATA:  Cough. Fall off porch 5 days ago injuring left shoulder and sternum. EXAM: CHEST - 2 VIEW COMPARISON:  None. FINDINGS: The cardiomediastinal contours are normal. The lungs are clear. Pulmonary vasculature is normal. No consolidation, pleural effusion, or pneumothorax. Moderate S-shaped scoliotic curvature of spine. No acute osseous abnormalities are seen. IMPRESSION: 1. No acute chest findings. No explanation for cough or evidence of acute injury. 2. Moderate scoliotic curvature of spine. Electronically Signed   By: Narda RutherfordMelanie  Sanford M.D.   On: 01/07/2019 20:45   Dg Sternum  Result Date: 01/07/2019 CLINICAL DATA:  Fall off porch 5 days ago injuring left shoulder and sternum. Midsternal pain. EXAM: STERNUM - 2+  VIEW COMPARISON:  None. FINDINGS: Cortical margins of the sternum are intact. There is no evidence of fracture or other focal bone lesions. No retrosternal hematoma. IMPRESSION: No sternal fracture. Electronically Signed   By: Keith Rake M.D.   On: 01/07/2019 20:46   Dg Shoulder Left  Result Date: 01/07/2019 CLINICAL DATA:  Fall off porch 5 days ago injuring  left shoulder and sternum. Left shoulder pain. EXAM: LEFT SHOULDER - 2+ VIEW COMPARISON:  None. FINDINGS: There is no evidence of fracture or dislocation. There is no evidence of arthropathy or other focal bone abnormality. Soft tissues are unremarkable. IMPRESSION: Negative radiographs of the left shoulder. Electronically Signed   By: Keith Rake M.D.   On: 01/07/2019 20:45    I personally (independently) visualized and performed the interpretation of the images attached in this note.    Assessment and Plan: 28 y.o. male with chest and shoulder pain after falling from 6-8 feet from porch railing.  Chest Pain: Patient fell from the railing on his porch on 01/02/2019. He fell at least 6 feet. Reports that he was unconscious for a period of time. Since the injury, he has had pain across his anterior chest and ribs. Describes pain as pleuritic. Denies SOB, exercise intolerance, heart palpitations. X-ray did not show significant findings besides possible sternal fracture and scoliosis. If symptoms don't improve or radiologist reads x-ray as concerning, we may consider CT scan.  Recommend rest, purposeful deep breaths every 30 mins to aid lung clearance, use Tylenol or Ibuprofen. Prescribed short course of Tramadol for pain. Follow-up in 3-4 weeks.  Loss of Consciousness: Patient reported loss of conscious for an undefined period of time. Exact Hx of loss of consciousness from patient was unclear. Denies subsequent feelings of nausea, head fogginess, headache, or other neurological or cognitive changes. Concussion is very possible but more severe neurological process is unlikely due to lack of ongoing neurological symptoms.  Patient appears to be quite clinically well at this time.  He does not have any neurological symptoms.  Additionally C-spine exam is unremarkable.  Follow-up if symptoms present.  Left Shoulder Pain: Reports pain in left shoulder that is making him favor the arm. He has pain  with abduction above 90 degrees. X-ray showed no major findings. Bruising and muscle strain is likely.   Recommend rest, use Tylenol or Ibuprofen. Prescribed short course of Tramadol for pain. Follow-up in 3-4 weeks.    PDMP reviewed during this encounter. Orders Placed This Encounter  Procedures  . DG Chest 2 View    Order Specific Question:   Reason for exam:    Answer:   Cough, assess intra-thoracic pathology    Order Specific Question:   Preferred imaging location?    Answer:   [facility]  . DG Shoulder Left    Standing Status:   Future    Number of Occurrences:   1    Standing Expiration Date:   03/08/2020    Order Specific Question:   Reason for Exam (SYMPTOM  OR DIAGNOSIS REQUIRED)    Answer:   eval shoulder pain adfter fall    Order Specific Question:   Preferred imaging location?    Answer:   [facility]    Order Specific Question:   Radiology Contrast Protocol - do NOT remove file path    Answer:   \\[email or web address].[email or web address]  . DG Sternum    Standing Status:   Future    Number of Occurrences:   1    Standing  Expiration Date:   03/08/2020    Order Specific Question:   Reason for Exam (SYMPTOM  OR DIAGNOSIS REQUIRED)    Answer:   eval sternum after fall    Order Specific Question:   Preferred imaging [facility]?    [facility]    Order Specific Question:   Radiology Contrast Protocol - do NOT remove file path    Answer:   \\[email or web address]   Meds ordered this encounter  Medications  . cyclobenzaprine (FLEXERIL) 10 MG tablet    Sig: Take 1 tablet (10 mg total) by mouth 3 (three) times daily as needed for muscle spasms.    Dispense:  30 tablet    Refill:  3  . traMADol (ULTRAM) 50 MG tablet    Sig: Take 1 tablet (50 mg total) by mouth every 8 (eight) hours as needed for severe pain.    Dispense:  15 tablet    Refill:  0    Historical information moved  to improve visibility of documentation.  No past medical history on file. Past Surgical History:  Procedure Laterality Date  . APPENDECTOMY     Social History   Tobacco Use  . Smoking status: Never Smoker  . Smokeless tobacco: Never Used  Substance Use Topics  . Alcohol use: Not on file   family history is not on file.  Medications: Current Outpatient Medications  Medication Sig Dispense Refill  . [START ON 02/28/2019] amphetamine-dextroamphetamine (ADDERALL XR) 30 MG 24 hr capsule Take 1 capsule (30 mg total) by mouth every morning. 30 capsule 0  . [START ON 01/29/2019] amphetamine-dextroamphetamine (ADDERALL XR) 30 MG 24 hr capsule Take 1 capsule (30 mg total) by mouth every morning. 30 capsule 0  . amphetamine-dextroamphetamine (ADDERALL XR) 30 MG 24 hr capsule Take 1 capsule (30 mg total) by mouth every morning. 30 capsule 0  . [START ON 02/28/2019] amphetamine-dextroamphetamine (ADDERALL) 10 MG tablet Take 1 tablet (10 mg total) by mouth daily. At 2 pm PRN 30 tablet 0  . [START ON 01/29/2019] amphetamine-dextroamphetamine (ADDERALL) 10 MG tablet Take 1 tablet (10 mg total) by mouth daily. At 2 pm PRN 30 tablet 0  . amphetamine-dextroamphetamine (ADDERALL) 10 MG tablet Take one tablet at 2pm as needed. 30 tablet 0  . cyclobenzaprine (FLEXERIL) 10 MG tablet Take 1 tablet (10 mg total) by mouth 3 (three) times daily as needed for muscle spasms. 30 tablet 3  . traMADol (ULTRAM) 50 MG tablet Take 1 tablet (50 mg total) by mouth every 8 (eight) hours as needed for severe pain. 15 tablet 0   No current facility-administered medications for this visit.    No Known Allergies    Discussed warning signs or symptoms. Please see discharge instructions. Patient expresses understanding.  I personally was present and performed or re-performed the history, physical exam and medical decision-making activities of this service and have verified that the service and findings are accurately documented  in the student's note. ___________________________________________ Clementeen GrahamEvan Tywone Bembenek M.D., ABFM., CAQSM. Primary Care and Sports Medicine Adjunct Instructor of Family Medicine  [employer]

## 2019-01-07 NOTE — Patient Instructions (Signed)
Thank you for coming in today. Take it easy.  Take a deep breath every 30 mins while awake.  Use tylenol or ibuprofen.  Use tramadol sparingly.  Recheck in 3-4 weeks.  Return sooner if needed.  If concern on xray may do CT scan of the chest.    Sternal Fracture  A sternal fracture is a break in the bone in the center of the chest (sternum or breastbone). This type of fracture often causes pain that can get worse when you breathe deeply or cough. A sternal fracture is not dangerous unless there is also an injury to your heart or lungs, which are protected by the sternum and ribs. What are the causes? This condition is usually caused by a forceful injury from:  Motor vehicle accidents. This is the most common cause.  Contact sports.  Physical assaults.  Falls. You can also develop a sternal fracture without having a forceful injury if the bone becomes weakened over time (stress fracture or insufficiency fracture). What increases the risk? You are more likely to have a sternal fracture if you:  Participate in contact sports, such as football, lacrosse, wrestling, or martial arts.  Work at elevated heights, such as in Architect. This increases your risk of a fall. The following factors may make you more likely to develop a stress fracture or insufficiency fracture:  Being male.  Being a postmenopausal woman.  Being 39 years of age or older.  Having weak bones (osteoporosis).  Having severe curvature of the spine.  Being on long-term steroid treatment. What are the signs or symptoms? Symptoms of this condition include:  Pain over the sternum or chest wall.  Tenderness of the sternum or chest wall.  Pain that gets worse when you breathe deeply or cough.  Shortness of breath.  A bruise (contusion) over the chest.  Swelling.  A crackling sound when taking a deep breath or pressing on the sternum. How is this diagnosed? This condition is diagnosed based on:   A physical exam.  Your medical history.  Tests, such as: ? Blood oxygen level. This is measured with a pulse oximetry test. ? Repeated electrocardiograms (ECGs). This is to make sure that your heart is not injured. ? A blood test. This is to check for damage to your heart muscle. ? Imaging tests, such as:  A CT scan.  An ultrasound.  Chest X-rays. How is this treated? Treatment depends on the severity of your injury.  A sternal fracture without any other injury (isolated sternal fracture) usually heals without treatment. You may need to: ? Limit some activities at home. ? Take medicines for pain relief. ? Do deep breathing exercises to prevent injury and infection to your lungs.  In rare cases, surgery may be needed if a sternal fracture: ? Continues to cause severe pain. ? Causes shortness of breath or respiratory problems. ? Involves bones that have been moved too far out of position (displaced fracture). Follow these instructions at home: Managing pain, stiffness, and swelling   If directed, put ice on the injured area. ? Put ice in a plastic bag. ? Place a towel between your skin and the bag. ? Leave the ice on for 20 minutes, 2-3 times a day. Medicines  Take over-the-counter and prescription medicines only as told by your health care provider.  Ask your health care provider if the medicine prescribed to you: ? Requires you to avoid driving or using heavy machinery. ? Can cause constipation. You may need to  take actions to prevent or treat constipation, such as:  Drink enough fluid to keep your urine pale yellow.  Take over-the-counter or prescription medicines.  Eat foods that are high in fiber, such as beans, whole grains, and fresh fruits and vegetables.  Limit foods that are high in fat and processed sugars, such as fried or sweet foods. Activity  Rest at home.  Return to your normal activities as told by your health care provider. Ask your health care  provider what activities are safe for you.  Do breathing exercises as told by your health care provider.  Do not push or pull with your arms when getting in and out of bed.  Do not lift anything that is heavier than 10 lb (4.5 kg), or the limit that you are told, until your health care provider says that it is safe. General instructions  Hug a pillow when you sneeze, cough, or twist or bend at the waist. Doing this helps support your chest.  Do not use any products that contain nicotine or tobacco, such as cigarettes, e-cigarettes, and chewing tobacco. These can delay bone healing. If you need help quitting, ask your health care provider.  Keep all follow-up visits as told by your health care provider. This is important. Contact a health care provider if:  Your pain medicine is not helping.  You continue to have pain after several weeks.  You have swelling or bruising that gets worse.  You develop a fever or chills.  You develop a cough and you cough up thick or bloody mucus from your lungs (sputum). Get help right away if you:  Have difficulty breathing.  Have chest pain.  Feel light-headed.  Have fast or irregular heartbeats (palpitations).  Feel nauseous or have pain in your abdomen. Summary  A sternal fracture is a break in the bone in the center of the chest (sternum or breastbone).  This condition is usually caused by a forceful injury. The most common cause is motor vehicle accidents.  If directed, put ice on the injured area.  Return to your normal activities as told by your health care provider. Ask your health care provider what activities are safe for you. This information is not intended to replace advice given to you by your health care provider. Make sure you discuss any questions you have with your health care provider. Document Released: 12/26/2003 Document Revised: 05/14/2018 Document Reviewed: 05/14/2018 Elsevier Patient Education  2020 [employer]

## 2019-01-28 ENCOUNTER — Ambulatory Visit (INDEPENDENT_AMBULATORY_CARE_PROVIDER_SITE_OTHER): Payer: Commercial Managed Care - PPO | Admitting: Family Medicine

## 2019-01-28 ENCOUNTER — Other Ambulatory Visit: Payer: Self-pay

## 2019-01-28 VITALS — BP 134/85 | HR 89 | Temp 98.2°F | Wt 196.0 lb

## 2019-01-28 DIAGNOSIS — R0789 Other chest pain: Secondary | ICD-10-CM

## 2019-01-28 DIAGNOSIS — M25512 Pain in left shoulder: Secondary | ICD-10-CM | POA: Diagnosis not present

## 2019-01-28 DIAGNOSIS — R079 Chest pain, unspecified: Secondary | ICD-10-CM | POA: Diagnosis not present

## 2019-01-28 NOTE — Progress Notes (Signed)
Violet Baldyerry Kindig is a 28 y.o. male [facility] today for follow-up sternum and shoulder pain.  Patient fell about 6 feet off of a porch railing on August 8 landing on his chest and shoulder.  He was seen on August 13 for sternum pain.  Additionally he had some shoulder pain and as well.  X-rays of his chest sternum and shoulder were unremarkable.  He was treated with Tylenol ibuprofen and a short course of tramadol.  Additionally discussed deep breathing exercises to prevent atelectasis.  Also after the fall he had what appears to be a short episode of loss of consciousness.  This was witnessed by his spouse.  On subsequent follow-up with me he had no neurological symptoms and a normal C-spine exam.  Plan to proceed with watchful waiting.  In the interim he has done well.  He notes some continued mild chest and trapezius soreness but overall is much better than he has been.   ROS as above:  Exam:  BP 134/85   Pulse 89   Temp 98.2 F (36.8 C) (Oral)   Wt 196 lb (88.9 kg)   BMI 29.80 kg/m  Wt Readings from Last 5 Encounters:  01/28/19 196 lb (88.9 kg)  01/07/19 198 lb (89.8 kg)  12/29/18 204 lb (92.5 kg)  09/28/18 202 lb (91.6 kg)  06/23/18 200 lb (90.7 kg)    Gen: Well NAD HEENT: EOMI,  MMM Lungs: Normal work of breathing. CTABL Heart: RRR no MRG Abd: NABS, Soft. Nondistended, Nontender Exts: Brisk capillary refill, warm and well perfused.  Chest wall and trapezius are not particularly tender.     Assessment and Plan: 28 y.o. male with chest wall and shoulder pain following fall.  Significantly improved.  Discussed options.  Plan for continued home exercise program deep breathing and recheck as needed.  If not improving sufficiently neck step would be physical therapy.  If worsening may consider CT scan for further evaluation.  Recheck back  with me as needed.  I spent 15 minutes with this patient, greater than 50% was face-to-face time counseling regarding home exercise program and next up and backup plan.   PDMP not reviewed this encounter. No orders of the defined types were placed in this encounter.  No orders of the defined types were placed in this encounter.    Historical information moved to improve visibility of documentation.  No past medical history on file. Past Surgical History:  Procedure Laterality Date  . APPENDECTOMY     Social History   Tobacco Use  . Smoking status: Never Smoker  . Smokeless tobacco: Never Used  Substance Use Topics  . Alcohol use: Not on file   family history is not on file.  Medications: Current Outpatient Medications  Medication Sig Dispense Refill  . [START ON 02/28/2019] amphetamine-dextroamphetamine (ADDERALL XR) 30 MG 24 hr capsule Take 1 capsule (30 mg total) by mouth every morning. 30 capsule 0  . [START ON 01/29/2019] amphetamine-dextroamphetamine (ADDERALL XR) 30 MG 24 hr capsule Take 1 capsule (30 mg total) by mouth every morning. 30 capsule 0  . amphetamine-dextroamphetamine (ADDERALL XR) 30 MG 24 hr capsule Take 1 capsule (30 mg total) by mouth every morning. 30 capsule 0  . [START ON 02/28/2019] amphetamine-dextroamphetamine (ADDERALL) 10 MG tablet Take 1 tablet (10 mg total) by mouth daily. At 2 pm PRN 30 tablet 0  . [START ON 01/29/2019] amphetamine-dextroamphetamine (ADDERALL) 10 MG  tablet Take 1 tablet (10 mg total) by mouth daily. At 2 pm PRN 30 tablet 0  . amphetamine-dextroamphetamine (ADDERALL) 10 MG tablet Take one tablet at 2pm as needed. 30 tablet 0  . cyclobenzaprine (FLEXERIL) 10 MG tablet Take 1 tablet (10 mg total) by mouth 3 (three) times daily as needed for muscle spasms. 30 tablet 3  . traMADol (ULTRAM) 50 MG tablet Take 1 tablet (50 mg total) by mouth every 8 (eight) hours as needed for severe pain. 15 tablet 0   No current facility-administered  medications for this visit.    No Known Allergies   Discussed warning signs or symptoms. Please see discharge instructions. Patient expresses understanding.

## 2019-01-28 NOTE — Patient Instructions (Signed)
Thank you for coming in today. Recheck as needed. ] Next treatment option would be PT.  Return if not better or if worse.

## 2019-03-31 ENCOUNTER — Ambulatory Visit (INDEPENDENT_AMBULATORY_CARE_PROVIDER_SITE_OTHER): Payer: Commercial Managed Care - PPO | Admitting: Physician Assistant

## 2019-03-31 DIAGNOSIS — Z5329 Procedure and treatment not carried out because of patient's decision for other reasons: Secondary | ICD-10-CM

## 2019-03-31 NOTE — Progress Notes (Signed)
No show

## 2019-04-20 ENCOUNTER — Encounter: Payer: Self-pay | Admitting: Physician Assistant

## 2019-04-26 ENCOUNTER — Encounter: Payer: Self-pay | Admitting: Physician Assistant

## 2019-05-05 ENCOUNTER — Encounter: Payer: Self-pay | Admitting: Physician Assistant

## 2019-05-05 ENCOUNTER — Ambulatory Visit (INDEPENDENT_AMBULATORY_CARE_PROVIDER_SITE_OTHER): Payer: Commercial Managed Care - PPO | Admitting: Physician Assistant

## 2019-05-05 VITALS — BP 141/79 | HR 69 | Temp 98.1°F | Ht 68.0 in | Wt 202.0 lb

## 2019-05-05 DIAGNOSIS — F419 Anxiety disorder, unspecified: Secondary | ICD-10-CM | POA: Diagnosis not present

## 2019-05-05 DIAGNOSIS — F900 Attention-deficit hyperactivity disorder, predominantly inattentive type: Secondary | ICD-10-CM | POA: Diagnosis not present

## 2019-05-05 DIAGNOSIS — R4589 Other symptoms and signs involving emotional state: Secondary | ICD-10-CM | POA: Diagnosis not present

## 2019-05-05 DIAGNOSIS — F439 Reaction to severe stress, unspecified: Secondary | ICD-10-CM | POA: Diagnosis not present

## 2019-05-05 MED ORDER — AMPHETAMINE-DEXTROAMPHETAMINE 10 MG PO TABS: 10.0000 mg | ORAL_TABLET | Freq: Every day | ORAL | 0 refills | Status: DC

## 2019-05-05 MED ORDER — AMPHETAMINE-DEXTROAMPHETAMINE 10 MG PO TABS: ORAL_TABLET | ORAL | 0 refills | Status: DC

## 2019-05-05 MED ORDER — AMPHETAMINE-DEXTROAMPHET ER 30 MG PO CP24: 30.0000 mg | ORAL_CAPSULE | ORAL | 0 refills | Status: DC

## 2019-05-05 MED ORDER — ESCITALOPRAM OXALATE 5 MG PO TABS: 5.0000 mg | ORAL_TABLET | Freq: Every day | ORAL | 2 refills | Status: DC

## 2019-05-05 NOTE — Progress Notes (Signed)
141No issues. Needs refills on Adderall.

## 2019-05-05 NOTE — Progress Notes (Signed)
Subjective:    Patient ID: Michael Booker, male    DOB: [birth year], 28 y.o.   MRN: [id number]  HPI Patient is a 28 year old male who presents to the clinic for medication refill of Adderall.  Patient does have diagnosed ADHD.  He is well controlled on his current dose.  He finds that it is very helpful at work.  He denies any palpitations, headaches, dizziness, vision changes.  He does want to talk about some marital stress.  This is been ongoing for a long time.  He feels like he is in a toxic relationship.  He does feel trapped like he cannot leave due to the financial strain his wife has put on the marriage.  Lately this is really been getting him down.  He feels very unmotivated.  He does not have the usual energy to get up and go to work.  He is working 2-3 jobs just to support his family and getting her through school.  She is not willing to work.  He has a lot of anger built up in the relationship.  He denies any suicidal or homicidal thoughts.  He has not tried anything medication wise for his mood.  .. Active Ambulatory Problems    Diagnosis Date Noted  . Attention deficit hyperactivity disorder (ADHD), predominantly inattentive type 06/02/2017  . Childhood asthma 11/20/2018  . Depressed mood 05/05/2019  . Anxiety 05/05/2019  . Stress at home 05/05/2019   Resolved Ambulatory Problems    Diagnosis Date Noted  . No Resolved Ambulatory Problems   No Additional Past Medical History     Review of Systems See HPI.     Objective:   Physical Exam Vitals signs reviewed.  Constitutional:      Appearance: Normal appearance.  HENT:     Head: Normocephalic.  Cardiovascular:     Rate and Rhythm: Normal rate and regular rhythm.     Pulses: Normal pulses.  Pulmonary:     Effort: Pulmonary effort is normal.     Breath sounds: Normal breath sounds.  Neurological:     General: No focal deficit present.     Mental Status: He is alert and oriented to person, place, and time.   Psychiatric:        Mood and Affect: Mood normal.       .. Depression screen [facility] 2/9 05/05/2019 06/23/2018 03/25/2018 12/03/2017 06/02/2017  Decreased Interest 2 1 1 1 2   Down, Depressed, Hopeless 2 1 0 1 1  PHQ - 2 Score 4 2 1 2 3   Altered sleeping 1 0 0 1 1  Tired, decreased energy 3 1 0 1 1  Change in appetite 2 1 0 1 1  Feeling bad or failure about yourself  1 0 0 0 1  Trouble concentrating 1 1 0 1 1  Moving slowly or fidgety/restless 1 0 0 0 0  Suicidal thoughts 0 0 0 0 0  PHQ-9 Score 13 5 1 6 8   Difficult doing work/chores Somewhat difficult Somewhat difficult Not difficult at all Not difficult at all -   .. GAD 7 : Generalized Anxiety Score 05/05/2019 06/23/2018 03/25/2018 12/03/2017  Nervous, Anxious, on Edge 1 0 1 0  Control/stop worrying 1 1 1  0  Worry too much - different things 1 1 1 1   Trouble relaxing 1 0 1 1  Restless 0 0 1 0  Easily annoyed or irritable 0 1 2 1   Afraid - awful might happen 1 0 1 0  Total  GAD 7 Score 5 3 8 3   Anxiety Difficulty Somewhat difficult Not difficult at all Somewhat difficult Not difficult at all        Assessment & Plan:  [name] KitchenNicolis was seen today for adhd.  Diagnoses and all orders for this visit:  Attention deficit hyperactivity disorder (ADHD), predominantly inattentive type -     amphetamine-dextroamphetamine (ADDERALL) 10 MG tablet; Take 1 tablet (10 mg total) by mouth daily. At 2 pm PRN -     amphetamine-dextroamphetamine (ADDERALL) 10 MG tablet; Take 1 tablet (10 mg total) by mouth daily. At 2 pm PRN -     amphetamine-dextroamphetamine (ADDERALL) 10 MG tablet; Take one tablet at 2pm as needed. -     amphetamine-dextroamphetamine (ADDERALL XR) 30 MG 24 hr capsule; Take 1 capsule (30 mg total) by mouth every morning. -     amphetamine-dextroamphetamine (ADDERALL XR) 30 MG 24 hr capsule; Take 1 capsule (30 mg total) by mouth every morning. -     amphetamine-dextroamphetamine (ADDERALL XR) 30 MG 24 hr capsule; Take 1 capsule (30 mg  total) by mouth every morning.  Anxiety -     escitalopram (LEXAPRO) 5 MG tablet; Take 1 tablet (5 mg total) by mouth daily.  Depressed mood -     escitalopram (LEXAPRO) 5 MG tablet; Take 1 tablet (5 mg total) by mouth daily.  Stress at home -     escitalopram (LEXAPRO) 5 MG tablet; Take 1 tablet (5 mg total) by mouth daily.   PHQ-9 and GAD-7 are elevated today.  I strongly advised patient to consider counseling.  I think this could help him make some big decisions that he needs to make in the future.  I explained to him that medication will not fix everything he needs to work through these problems at home.  I did agree to start Lexapro 5 mg daily.  Discussed that we will take some time to completely get in his system and work.  Give at least 6 to 8 weeks before follow-up.  If he has any worsening mood or depression please follow-up sooner and/or stop medication.  Adderall refilled for 3 months.

## 2019-05-19 ENCOUNTER — Ambulatory Visit: Payer: Commercial Managed Care - PPO

## 2019-07-13 ENCOUNTER — Encounter: Payer: Self-pay | Admitting: Physician Assistant

## 2019-07-23 ENCOUNTER — Encounter: Payer: Self-pay | Admitting: Physician Assistant

## 2019-07-27 ENCOUNTER — Telehealth: Payer: Self-pay | Admitting: Physician Assistant

## 2019-07-27 NOTE — Telephone Encounter (Signed)
Received fax for PA on Amphet/Dext sent through cover my meds and received fax requesting additional information. Filled out form faxed back and received authorization. Valid 12 months 07/26/2019 - 07/25/2020 - CF

## 2019-08-06 ENCOUNTER — Telehealth: Payer: Self-pay | Admitting: Physician Assistant

## 2019-08-06 NOTE — Telephone Encounter (Signed)
Received fax for PA on Amphetamine-Dextroamphetamine sent through cover my meds waiting on determination. - CF

## 2019-09-07 ENCOUNTER — Other Ambulatory Visit: Payer: Self-pay | Admitting: Physician Assistant

## 2019-09-07 DIAGNOSIS — F900 Attention-deficit hyperactivity disorder, predominantly inattentive type: Secondary | ICD-10-CM

## 2019-09-07 MED ORDER — AMPHETAMINE-DEXTROAMPHET ER 30 MG PO CP24: 30.0000 mg | ORAL_CAPSULE | ORAL | 0 refills | Status: DC

## 2019-09-14 ENCOUNTER — Other Ambulatory Visit: Payer: Self-pay | Admitting: Physician Assistant

## 2019-09-14 DIAGNOSIS — F900 Attention-deficit hyperactivity disorder, predominantly inattentive type: Secondary | ICD-10-CM

## 2019-09-14 NOTE — Telephone Encounter (Signed)
RX already sent to pharmacy

## 2019-10-07 ENCOUNTER — Other Ambulatory Visit: Payer: Self-pay | Admitting: Physician Assistant

## 2019-10-07 DIAGNOSIS — F900 Attention-deficit hyperactivity disorder, predominantly inattentive type: Secondary | ICD-10-CM

## 2019-10-08 ENCOUNTER — Encounter: Payer: Self-pay | Admitting: Physician Assistant

## 2019-10-08 DIAGNOSIS — F900 Attention-deficit hyperactivity disorder, predominantly inattentive type: Secondary | ICD-10-CM

## 2019-10-08 NOTE — Telephone Encounter (Signed)
Duplicate med refill for adderall. Unable to denied request. Restricted access.

## 2019-10-14 NOTE — Addendum Note (Signed)
Addended bySilvio Pate on: 10/14/2019 11:42 AM   Modules accepted: Orders

## 2019-10-14 NOTE — Telephone Encounter (Signed)
Please send to 10 mg. Thanks!

## 2019-10-15 MED ORDER — AMPHETAMINE-DEXTROAMPHETAMINE 10 MG PO TABS: 10.0000 mg | ORAL_TABLET | Freq: Every day | ORAL | 0 refills | Status: DC

## 2019-10-15 NOTE — Addendum Note (Signed)
Addended by: Nani Gasser D on: 10/15/2019 10:24 AM   Modules accepted: Orders

## 2019-10-21 NOTE — Telephone Encounter (Signed)
Checked on prior auth through cover my meds and Amphet/Dextr 10 mg is approved from 08/06/2019 - 08/05/2020. - CF

## 2019-11-15 ENCOUNTER — Ambulatory Visit (INDEPENDENT_AMBULATORY_CARE_PROVIDER_SITE_OTHER): Payer: Commercial Managed Care - PPO | Admitting: Physician Assistant

## 2019-11-15 ENCOUNTER — Encounter: Payer: Self-pay | Admitting: Physician Assistant

## 2019-11-15 VITALS — BP 124/72 | HR 79 | Wt 206.0 lb

## 2019-11-15 DIAGNOSIS — Z131 Encounter for screening for diabetes mellitus: Secondary | ICD-10-CM

## 2019-11-15 DIAGNOSIS — R5383 Other fatigue: Secondary | ICD-10-CM

## 2019-11-15 DIAGNOSIS — F900 Attention-deficit hyperactivity disorder, predominantly inattentive type: Secondary | ICD-10-CM | POA: Diagnosis not present

## 2019-11-15 DIAGNOSIS — Z13220 Encounter for screening for lipoid disorders: Secondary | ICD-10-CM

## 2019-11-15 DIAGNOSIS — G4719 Other hypersomnia: Secondary | ICD-10-CM | POA: Insufficient documentation

## 2019-11-15 DIAGNOSIS — R6882 Decreased libido: Secondary | ICD-10-CM | POA: Insufficient documentation

## 2019-11-15 MED ORDER — AMPHETAMINE-DEXTROAMPHETAMINE 10 MG PO TABS: 10.0000 mg | ORAL_TABLET | Freq: Every day | ORAL | 0 refills | Status: DC

## 2019-11-15 MED ORDER — AMPHETAMINE-DEXTROAMPHETAMINE 10 MG PO TABS: ORAL_TABLET | ORAL | 0 refills | Status: DC

## 2019-11-15 MED ORDER — AMPHETAMINE-DEXTROAMPHET ER 30 MG PO CP24: 30.0000 mg | ORAL_CAPSULE | ORAL | 0 refills | Status: DC

## 2019-11-15 NOTE — Progress Notes (Addendum)
Subjective:    Patient ID: Michael Booker, male    DOB: 25-Jul-1990, 29 y.o.   MRN: [id number]  HPI  Patient is a 29 year old male presenting for refills of Adderall. Patient has diagnosed ADHD. He is well controlled on current dose and denies concerns. He denies palpitations, chest pains, headaches, dizziness, vision changes, weight changes, appetite changes. Patient states he ran out of medication so he is more fatigued. Patient interested in testosterone levels to be drawn. He does feel like libido has decreased.   BP controlled today. Denies difficulty with sleeping. Denies anxiety or depression. States he is not on the Lexapro anymore. He denies any depression or anxiety.   .. Active Ambulatory Problems    Diagnosis Date Noted  . Attention deficit hyperactivity disorder (ADHD), predominantly inattentive type 06/02/2017  . Childhood asthma 11/20/2018  . Depressed mood 05/05/2019  . Anxiety 05/05/2019  . Stress at home 05/05/2019   Resolved Ambulatory Problems    Diagnosis Date Noted  . No Resolved Ambulatory Problems   No Additional Past Medical History     Review of Systems  Constitutional: Negative.   HENT: Negative.   Eyes: Negative.   Respiratory: Negative.   Cardiovascular: Negative.   Gastrointestinal: Negative.   Genitourinary: Negative.   Neurological: Negative.   Psychiatric/Behavioral: Negative.        Objective:   Physical Exam Constitutional:      Appearance: Normal appearance. He is normal weight.  HENT:     Head: Normocephalic and atraumatic.  Cardiovascular:     Rate and Rhythm: Normal rate and regular rhythm.     Pulses: Normal pulses.     Heart sounds: Normal heart sounds.  Pulmonary:     Effort: Pulmonary effort is normal.     Breath sounds: Normal breath sounds.  Skin:    General: Skin is warm and dry.  Neurological:     Mental Status: He is alert.  Psychiatric:        Mood and Affect: Mood normal.        Behavior: Behavior normal.          Assessment & Plan:  [name] KitchenMarland KitchenRene was seen today for medication refill.  Diagnoses and all orders for this visit:  Attention deficit hyperactivity disorder (ADHD), predominantly inattentive type -     amphetamine-dextroamphetamine (ADDERALL) 10 MG tablet; Take 1 tablet (10 mg total) by mouth daily. At 2 pm PRN -     amphetamine-dextroamphetamine (ADDERALL) 10 MG tablet; Take one tablet at 2pm as needed. -     amphetamine-dextroamphetamine (ADDERALL) 10 MG tablet; Take 1 tablet (10 mg total) by mouth daily. At 2 pm PRN -     amphetamine-dextroamphetamine (ADDERALL XR) 30 MG 24 hr capsule; Take 1 capsule (30 mg total) by mouth every morning. -     amphetamine-dextroamphetamine (ADDERALL XR) 30 MG 24 hr capsule; Take 1 capsule (30 mg total) by mouth every morning. -     amphetamine-dextroamphetamine (ADDERALL XR) 30 MG 24 hr capsule; Take 1 capsule (30 mg total) by mouth every morning.  Screening for diabetes mellitus -     COMPLETE METABOLIC PANEL WITH GFR  Screening for lipid disorders -     Lipid Panel w/reflex Direct LDL  No energy -     COMPLETE METABOLIC PANEL WITH GFR -     Testosterone -     CBC -     VITAMIN D 25 Hydroxy (Vit-D Deficiency, Fractures) -     B12 -  TSH -     Lipid Panel w/reflex Direct LDL  Excessive daytime sleepiness -     COMPLETE METABOLIC PANEL WITH GFR -     Testosterone -     CBC -     VITAMIN D 25 Hydroxy (Vit-D Deficiency, Fractures) -     B12 -     TSH -     Lipid Panel w/reflex Direct LDL  Decreased libido    - continue Adderall XR 30MG  daily and Adderall 10MG  at [facility] as needed.  -refills sent for 3 months.   Fatigue, decreased libido - Testosterone, CBC, CMP, Vitamin B, Vitamin B12, TSH, Lipids ordered.    Follow up in 3 months for medication management. Will call with lab results when available.  [facility] PA-C, have reviewed and agree with the above documentation in it's entirety.

## 2019-11-16 NOTE — Progress Notes (Signed)
Kaulder,   Kidney, liver, glucose look great.  Hemoglobin perfect.  Cholesterol good.   Some labs still pending.

## 2019-11-17 LAB — COMPLETE METABOLIC PANEL WITH GFR
AG Ratio: 2 (calc) (ref 1.0–2.5)
ALT: 19 U/L (ref 9–46)
AST: 16 U/L (ref 10–40)
Albumin: 4.5 g/dL (ref 3.6–5.1)
Alkaline phosphatase (APISO): 49 U/L (ref 36–130)
BUN: 15 mg/dL (ref 7–25)
CO2: 29 mmol/L (ref 20–32)
Calcium: 9.8 mg/dL (ref 8.6–10.3)
Chloride: 104 mmol/L (ref 98–110)
Creat: 1.02 mg/dL (ref 0.60–1.35)
GFR, Est African American: 113 mL/min/{1.73_m2} (ref >=60)
GFR, Est Non African American: 97 mL/min/{1.73_m2} (ref >=60)
Globulin: 2.2 g/dL (calc) (ref 1.9–3.7)
Glucose, Bld: 97 mg/dL (ref 65–99)
Potassium: 4.4 mmol/L (ref 3.5–5.3)
Sodium: 139 mmol/L (ref 135–146)
Total Bilirubin: 0.5 mg/dL (ref 0.2–1.2)
Total Protein: 6.7 g/dL (ref 6.1–8.1)

## 2019-11-17 LAB — CBC
HCT: 45.4 % (ref 38.5–50.0)
Hemoglobin: 15.4 g/dL (ref 13.2–17.1)
MCH: 29.7 pg (ref 27.0–33.0)
MCHC: 33.9 g/dL (ref 32.0–36.0)
MCV: 87.5 fL (ref 80.0–100.0)
MPV: 9.8 fL (ref 7.5–12.5)
Platelets: 225 10*3/uL (ref 140–400)
RBC: 5.19 10*6/uL (ref 4.20–5.80)
RDW: 11.8 % (ref 11.0–15.0)
WBC: 6.8 10*3/uL (ref 3.8–10.8)

## 2019-11-17 LAB — LIPID PANEL W/REFLEX DIRECT LDL
Cholesterol: 146 mg/dL (ref <200)
HDL: 35 mg/dL — ABNORMAL LOW (ref >=40)
LDL Cholesterol (Calc): 88 mg/dL (calc)
Non-HDL Cholesterol (Calc): 111 mg/dL (calc) (ref <130)
Total CHOL/HDL Ratio: 4.2 (calc) (ref <5.0)
Triglycerides: 129 mg/dL (ref <150)

## 2019-11-17 LAB — TSH: TSH: 1.57 mIU/L (ref 0.40–4.50)

## 2019-11-17 LAB — TESTOSTERONE: Testosterone: 329 ng/dL (ref 250–827)

## 2019-11-17 LAB — VITAMIN D 25 HYDROXY (VIT D DEFICIENCY, FRACTURES): Vit D, 25-Hydroxy: 29 ng/mL — ABNORMAL LOW (ref 30–100)

## 2019-11-17 LAB — VITAMIN B12: Vitamin B-12: 452 pg/mL (ref 200–1100)

## 2019-11-17 NOTE — Progress Notes (Signed)
[place]ab,   B12 good.  Vitamin d is low start 3000units daily with a meal/protein.  Testosterone is normal but on the low side of normal. I would try to increase testosterone naturally with testosterone rich foods, weight lifting, start the vitamin D, add daily zinc, make sure getting good rest at night, and consider herb ashwagandha(studied to boost testosterone)  We can recheck in a few months and what progress you have made.

## 2020-02-15 ENCOUNTER — Encounter: Payer: Self-pay | Admitting: Physician Assistant

## 2020-02-15 ENCOUNTER — Ambulatory Visit (INDEPENDENT_AMBULATORY_CARE_PROVIDER_SITE_OTHER): Payer: Commercial Managed Care - PPO | Admitting: Physician Assistant

## 2020-02-15 VITALS — BP 113/79 | HR 102 | Ht 68.0 in | Wt 199.0 lb

## 2020-02-15 DIAGNOSIS — R5383 Other fatigue: Secondary | ICD-10-CM | POA: Diagnosis not present

## 2020-02-15 DIAGNOSIS — Z7185 Encounter for immunization safety counseling: Secondary | ICD-10-CM

## 2020-02-15 DIAGNOSIS — G4719 Other hypersomnia: Secondary | ICD-10-CM

## 2020-02-15 DIAGNOSIS — Z23 Encounter for immunization: Secondary | ICD-10-CM

## 2020-02-15 DIAGNOSIS — Z7189 Other specified counseling: Secondary | ICD-10-CM | POA: Diagnosis not present

## 2020-02-15 DIAGNOSIS — F900 Attention-deficit hyperactivity disorder, predominantly inattentive type: Secondary | ICD-10-CM

## 2020-02-15 MED ORDER — AMPHETAMINE-DEXTROAMPHET ER 30 MG PO CP24: 30.0000 mg | ORAL_CAPSULE | ORAL | 0 refills | Status: DC

## 2020-02-15 MED ORDER — AMPHETAMINE-DEXTROAMPHETAMINE 10 MG PO TABS: 10.0000 mg | ORAL_TABLET | Freq: Every day | ORAL | 0 refills | Status: DC

## 2020-02-15 MED ORDER — AMPHETAMINE-DEXTROAMPHETAMINE 10 MG PO TABS: ORAL_TABLET | ORAL | 0 refills | Status: DC

## 2020-02-15 NOTE — Progress Notes (Signed)
   Subjective:    Patient ID: Michael Booker, male    DOB: 1990/11/11, 29 y.o.   MRN: [id number]  HPI  Patient is a 29 year old male with ADHD who presents to the clinic for refills.  Patient is doing well on Adderall.  He has no concerns or complaints.  He denies any chest pain, palpitations, headaches, vision changes, problems sleeping.  He denies any mood changes.  In fact his energy and mood are better than last visit.  He has started some testosterone boosters as well as some vitamins.  He does have questions about the Covid vaccine.  .. Active Ambulatory Problems    Diagnosis Date Noted  . Attention deficit hyperactivity disorder (ADHD), predominantly inattentive type 06/02/2017  . Childhood asthma 11/20/2018  . Depressed mood 05/05/2019  . Anxiety 05/05/2019  . Stress at home 05/05/2019  . Decreased libido 11/15/2019  . Excessive daytime sleepiness 11/15/2019  . No energy 11/15/2019   Resolved Ambulatory Problems    Diagnosis Date Noted  . No Resolved Ambulatory Problems   No Additional Past Medical History       Review of Systems  All other systems reviewed and are negative.      Objective:   Physical Exam Vitals reviewed.  Constitutional:      Appearance: Normal appearance.  Cardiovascular:     Rate and Rhythm: Normal rate and regular rhythm.  Pulmonary:     Effort: Pulmonary effort is normal.  Neurological:     General: No focal deficit present.     Mental Status: He is alert and oriented to person, place, and time.  Psychiatric:        Mood and Affect: Mood normal.           Assessment & Plan:  [name] KitchenMarland KitchenDiagnoses and all orders for this visit:  Attention deficit hyperactivity disorder (ADHD), predominantly inattentive type -     amphetamine-dextroamphetamine (ADDERALL XR) 30 MG 24 hr capsule; Take 1 capsule (30 mg total) by mouth every morning. -     amphetamine-dextroamphetamine (ADDERALL XR) 30 MG 24 hr capsule; Take 1 capsule (30 mg total) by mouth  every morning. -     amphetamine-dextroamphetamine (ADDERALL XR) 30 MG 24 hr capsule; Take 1 capsule (30 mg total) by mouth every morning. -     amphetamine-dextroamphetamine (ADDERALL) 10 MG tablet; Take 1 tablet (10 mg total) by mouth daily. At 2 pm PRN -     amphetamine-dextroamphetamine (ADDERALL) 10 MG tablet; Take one tablet at 2pm as needed. -     amphetamine-dextroamphetamine (ADDERALL) 10 MG tablet; Take 1 tablet (10 mg total) by mouth daily. At 2 pm PRN  Vaccine counseling  Need for Tdap vaccination -     Tdap vaccine greater than or equal to 7yo IM  No energy  Excessive daytime sleepiness   Refilled for 3 months Adderall.  Tdap given today. Patient declined flu shot. Discussed Covid vaccine and risk versus benefits.  Strongly encourage patient to get Covid vaccine.  Explained the differences between the 3 vaccines.  Overall his mood/energy is much better on his vitamins and supplements.  He is doing great. Continue current medications.

## 2020-02-15 NOTE — Patient Instructions (Signed)
Michael Booker( J and J)

## 2020-02-19 IMAGING — DX LEFT SHOULDER - 2+ VIEW
3 series · 3 of 3 positions shown · non-contrast
Comparison: None.

CLINICAL DATA: Fall off porch 5 days ago injuring left shoulder and
sternum. Left shoulder pain.

EXAM:
LEFT SHOULDER - 2+ VIEW

[shoulder grashey]
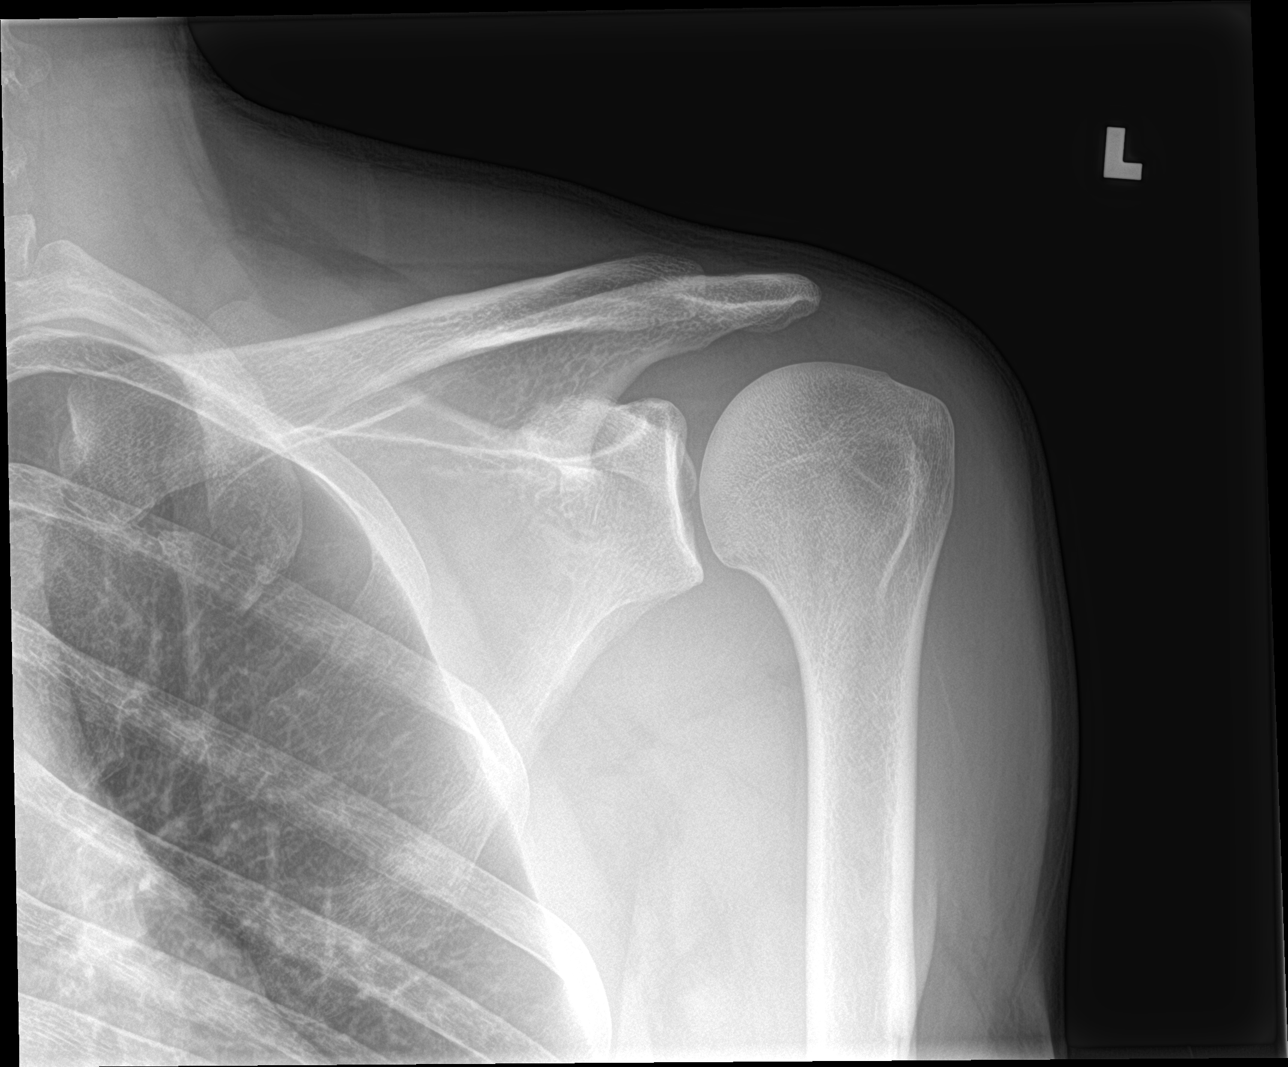

[shoulder y view]
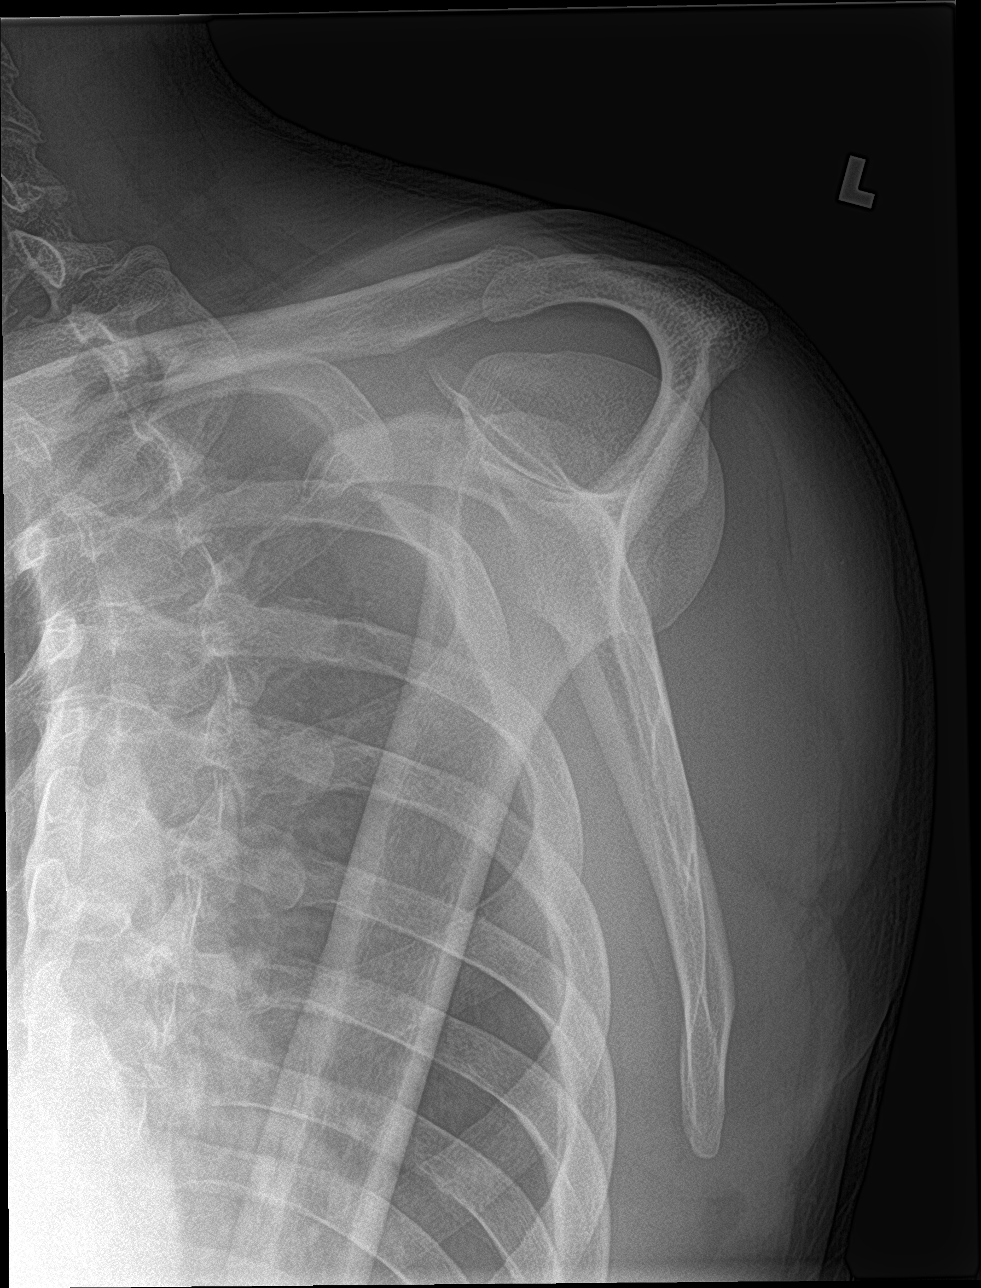

[shoulder axillary]
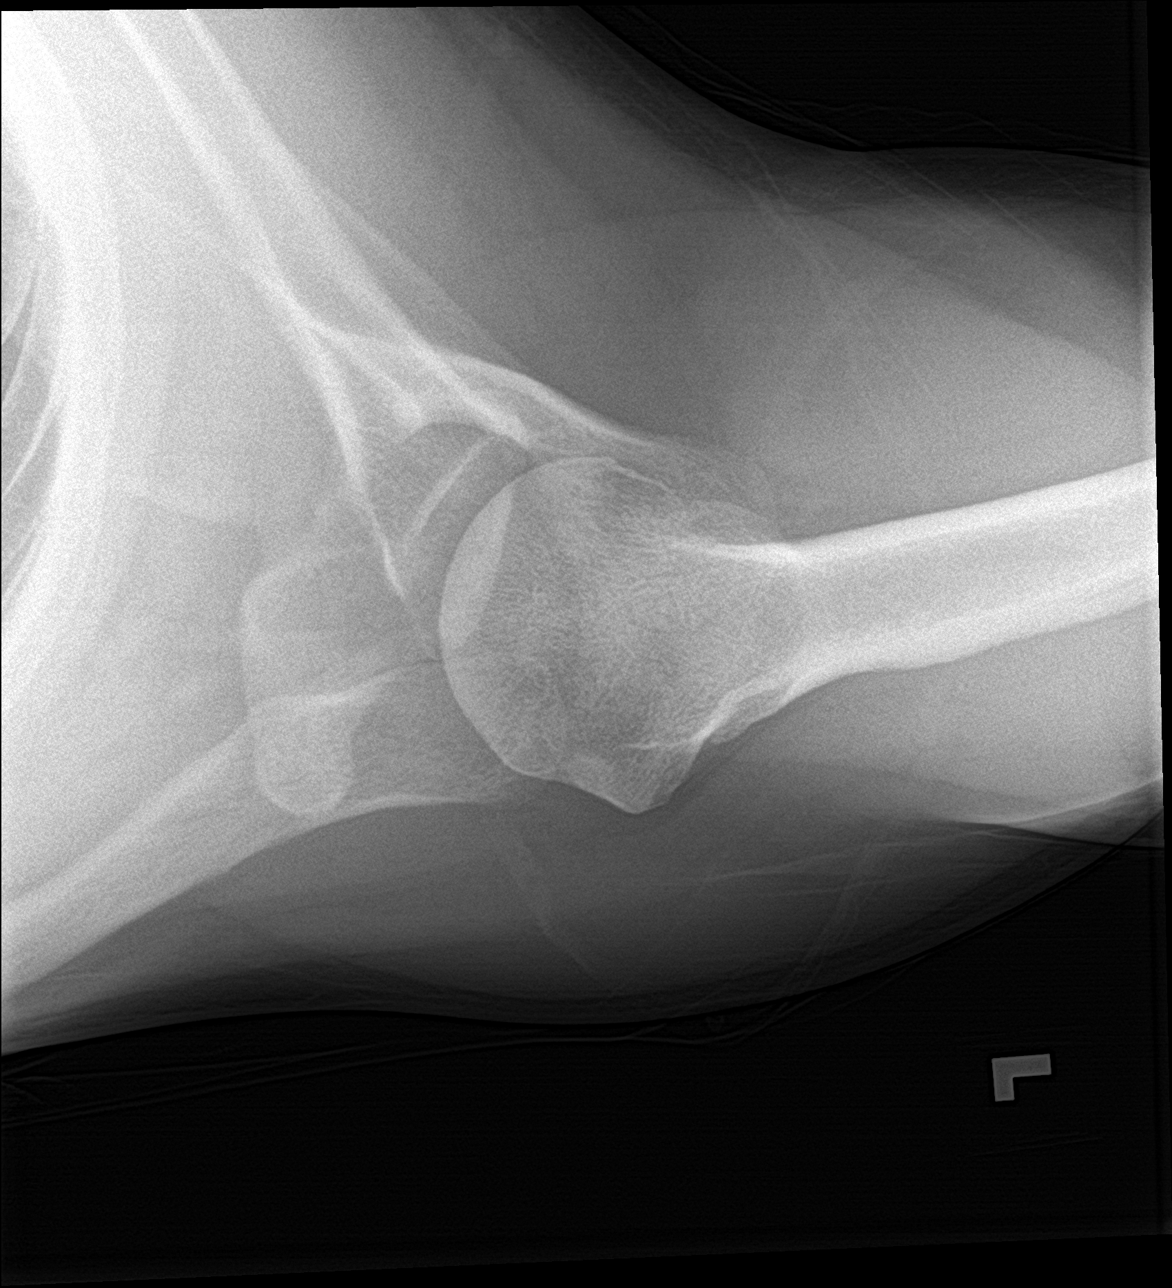

[3 of 3 positions shown; findings below may reference images not displayed]

FINDINGS: There is no evidence of fracture or dislocation. There is no
evidence of arthropathy or other focal bone abnormality. Soft
tissues are unremarkable.
IMPRESSION: Negative radiographs of the left shoulder.

## 2020-02-19 IMAGING — DX CHEST - 2 VIEW
2 series · 2 of 2 positions shown · non-contrast
Comparison: None.

CLINICAL DATA: Cough. Fall off porch 5 days ago injuring left
shoulder and sternum.

EXAM:
CHEST - 2 VIEW

[chest pa]
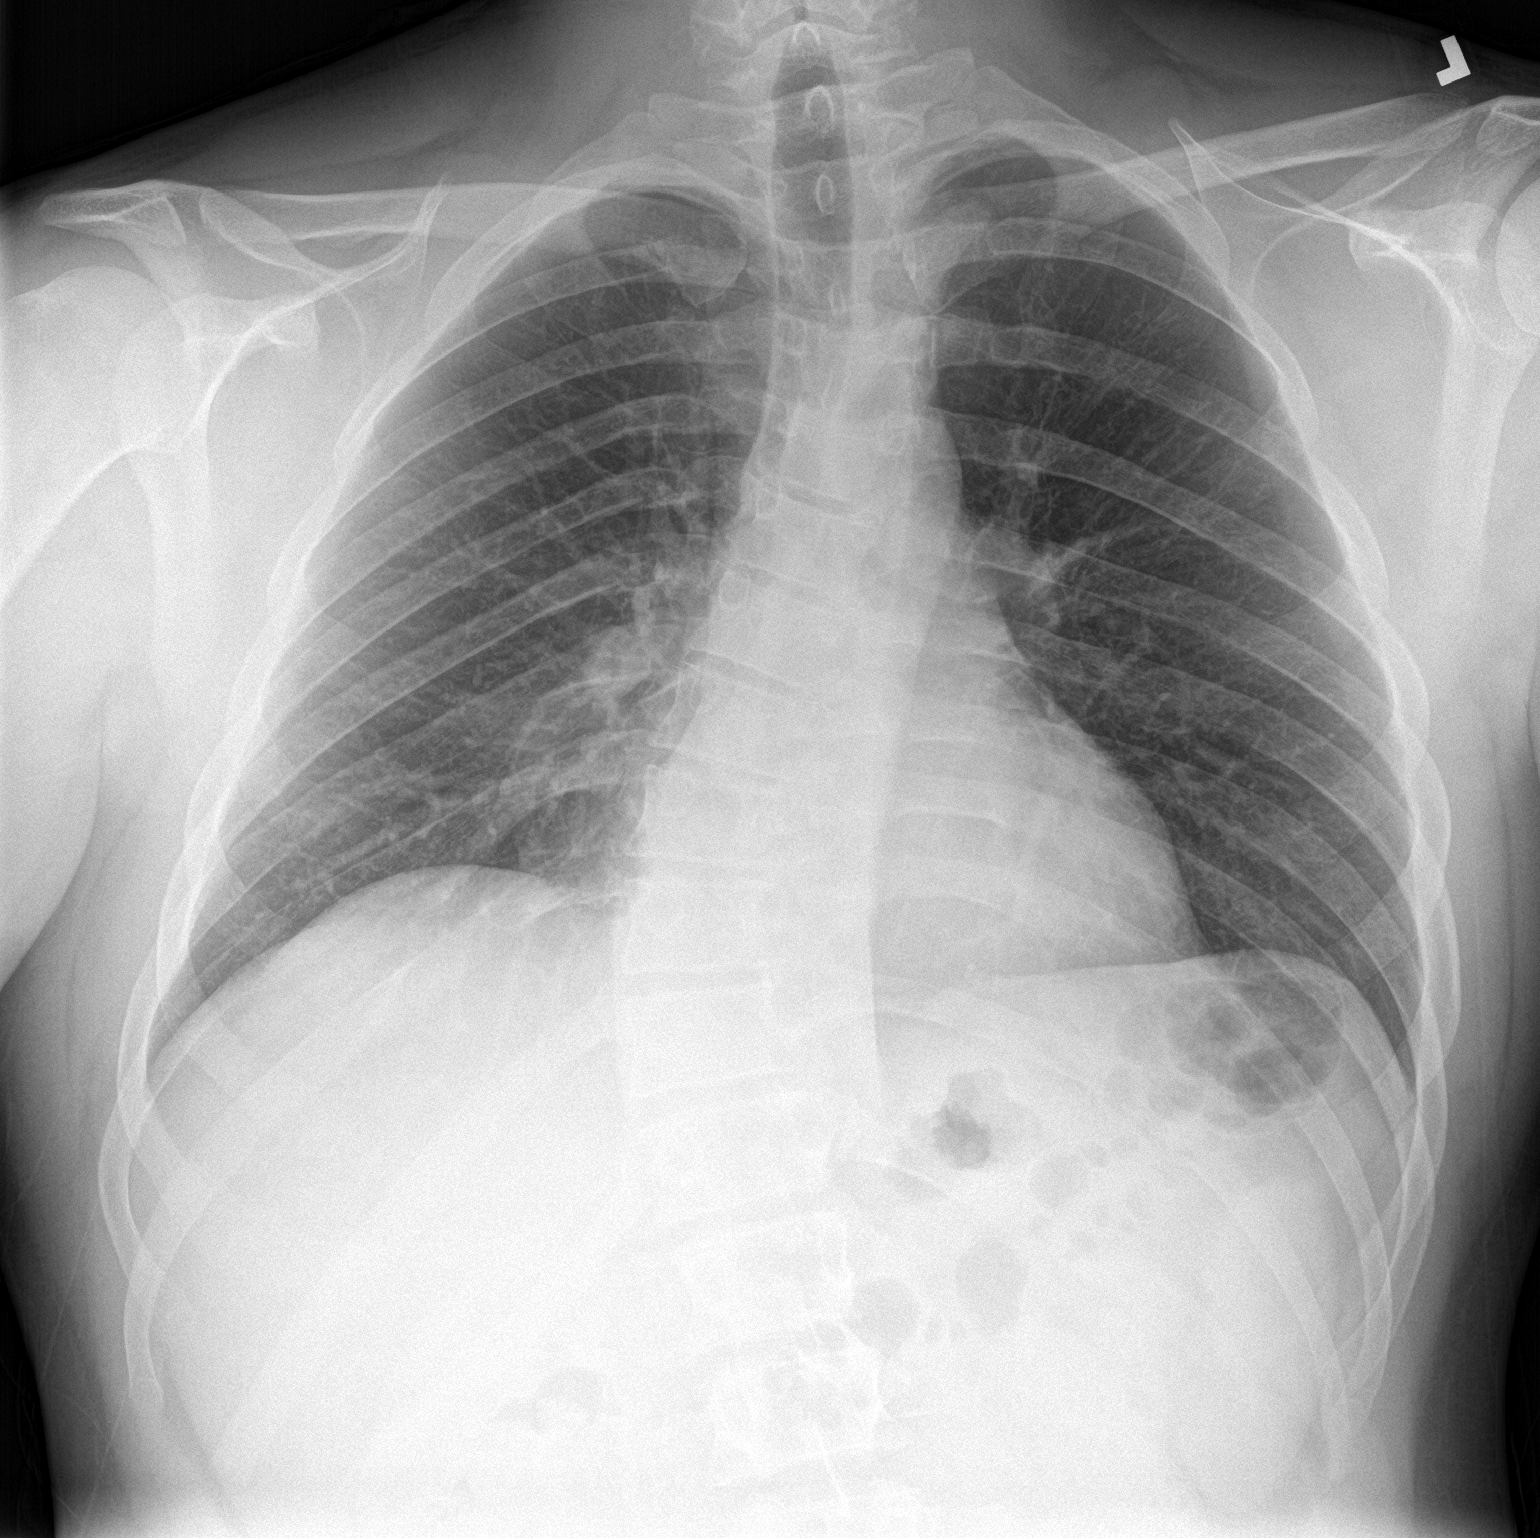

[chest lat]
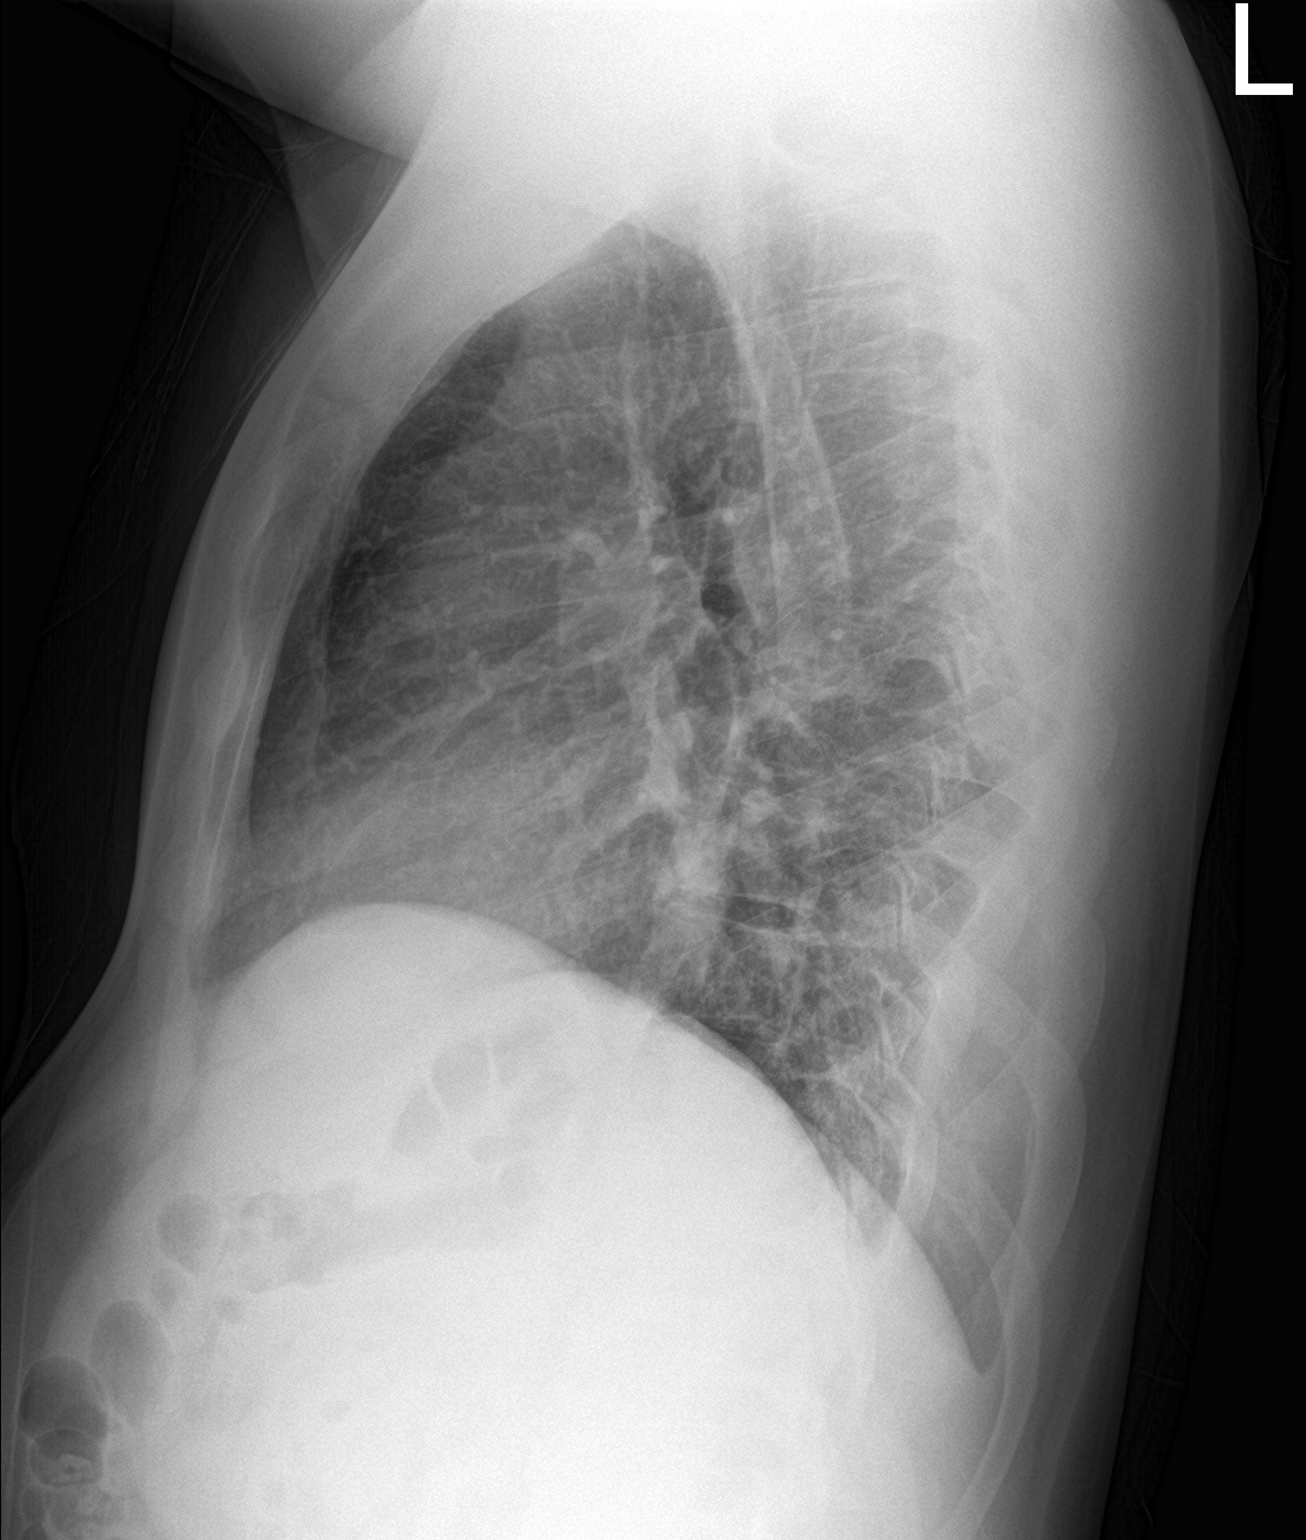

[2 of 2 positions shown; findings below may reference images not displayed]

FINDINGS: The cardiomediastinal contours are normal. The lungs are clear.
Pulmonary vasculature is normal. No consolidation, pleural effusion,
or pneumothorax. Moderate S-shaped scoliotic curvature of spine. No
acute osseous abnormalities are seen.
IMPRESSION: 1. No acute chest findings. No explanation for cough or evidence of
acute injury.
2. Moderate scoliotic curvature of spine.

## 2020-08-17 ENCOUNTER — Other Ambulatory Visit: Payer: Self-pay | Admitting: Physician Assistant

## 2020-08-17 DIAGNOSIS — F900 Attention-deficit hyperactivity disorder, predominantly inattentive type: Secondary | ICD-10-CM

## 2020-08-18 MED ORDER — AMPHETAMINE-DEXTROAMPHETAMINE 10 MG PO TABS: 10.0000 mg | ORAL_TABLET | Freq: Every day | ORAL | 0 refills | Status: DC

## 2020-08-18 MED ORDER — AMPHETAMINE-DEXTROAMPHET ER 30 MG PO CP24: 30.0000 mg | ORAL_CAPSULE | ORAL | 0 refills | Status: DC

## 2020-08-18 NOTE — Telephone Encounter (Signed)
Needs appt will send in one more refill.

## 2020-08-30 ENCOUNTER — Telehealth: Payer: Self-pay | Admitting: Physician Assistant

## 2020-08-30 NOTE — Telephone Encounter (Signed)
Patient left a parking placard to be filled out by provider. Billing form attached to paper & placed in provider box. ANM *08/30/20*

## 2020-09-04 ENCOUNTER — Encounter: Payer: Self-pay | Admitting: Physician Assistant

## 2020-09-04 ENCOUNTER — Ambulatory Visit: Payer: Commercial Managed Care - PPO | Admitting: Physician Assistant

## 2020-09-04 ENCOUNTER — Other Ambulatory Visit: Payer: Self-pay

## 2020-09-04 VITALS — BP 114/72 | HR 86 | Ht 68.0 in | Wt 194.0 lb

## 2020-09-04 DIAGNOSIS — M549 Dorsalgia, unspecified: Secondary | ICD-10-CM | POA: Insufficient documentation

## 2020-09-04 DIAGNOSIS — G8929 Other chronic pain: Secondary | ICD-10-CM

## 2020-09-04 DIAGNOSIS — Z1329 Encounter for screening for other suspected endocrine disorder: Secondary | ICD-10-CM

## 2020-09-04 DIAGNOSIS — Z13220 Encounter for screening for lipoid disorders: Secondary | ICD-10-CM | POA: Diagnosis not present

## 2020-09-04 DIAGNOSIS — F900 Attention-deficit hyperactivity disorder, predominantly inattentive type: Secondary | ICD-10-CM | POA: Diagnosis not present

## 2020-09-04 DIAGNOSIS — Z131 Encounter for screening for diabetes mellitus: Secondary | ICD-10-CM

## 2020-09-04 DIAGNOSIS — M419 Scoliosis, unspecified: Secondary | ICD-10-CM | POA: Insufficient documentation

## 2020-09-04 MED ORDER — AMPHETAMINE-DEXTROAMPHET ER 30 MG PO CP24: 30.0000 mg | ORAL_CAPSULE | ORAL | 0 refills | Status: DC

## 2020-09-04 MED ORDER — AMPHETAMINE-DEXTROAMPHETAMINE 10 MG PO TABS: 10.0000 mg | ORAL_TABLET | Freq: Every day | ORAL | 0 refills | Status: DC

## 2020-09-04 MED ORDER — AMPHETAMINE-DEXTROAMPHETAMINE 10 MG PO TABS: ORAL_TABLET | ORAL | 0 refills | Status: DC

## 2020-09-04 NOTE — Progress Notes (Signed)
Subjective:    Patient ID: Michael Booker, male    DOB: [birth year], 30 y.o.   MRN: [id number]  HPI Patient is a 30 year old male with ADHD, anxiety, chronic back pain, scoliosis who presents to the clinic for medication refills.  Patient denies any shortness of breath, palpitations, headaches, chest pain, dizziness, vision changes.  He is not on any blood pressure medication.  Patient does admit he is stressed and down more about the world financial situation and worse.  He just feels like everything is out of control.  Patient denies any suicidal thoughts or homicidal idealizations.  He is not on any medication for this.  Patient has been seen in the past by orthopedic for his scoliosis and chronic low back pain.  He treats this symptomatically with yoga, ibuprofen, massage.  He has had a DMV placard that he uses when he has to walk long distances in his back is in a flare.  He has to get that refilled today.  .. Active Ambulatory Problems    Diagnosis Date Noted  . Attention deficit hyperactivity disorder (ADHD), predominantly inattentive type 06/02/2017  . Childhood asthma 11/20/2018  . Depressed mood 05/05/2019  . Anxiety 05/05/2019  . Stress at home 05/05/2019  . Decreased libido 11/15/2019  . Excessive daytime sleepiness 11/15/2019  . No energy 11/15/2019  . Scoliosis of thoracolumbar spine 09/04/2020  . Chronic mid back pain 09/04/2020   Resolved Ambulatory Problems    Diagnosis Date Noted  . No Resolved Ambulatory Problems   No Additional Past Medical History     Review of Systems  All other systems reviewed and are negative.      Objective:   Physical Exam Vitals reviewed.  Constitutional:      Appearance: Normal appearance.  Cardiovascular:     Rate and Rhythm: Normal rate and regular rhythm.     Pulses: Normal pulses.  Pulmonary:     Effort: Pulmonary effort is normal.     Breath sounds: Normal breath sounds.  Neurological:     General: No focal deficit  present.     Mental Status: He is alert and oriented to person, place, and time.  Psychiatric:        Mood and Affect: Mood normal.    .. Depression screen [facility] 2/9 09/04/2020 11/15/2019 05/05/2019 06/23/2018 03/25/2018  Decreased Interest 1 1 2 1 1   Down, Depressed, Hopeless 1 0 2 1 0  PHQ - 2 Score 2 1 4 2 1   Altered sleeping 0 0 1 0 0  Tired, decreased energy 1 2 3 1  0  Change in appetite 1 1 2 1  0  Feeling bad or failure about yourself  0 0 1 0 0  Trouble concentrating 3 3 1 1  0  Moving slowly or fidgety/restless 0 0 1 0 0  Suicidal thoughts 0 0 0 0 0  PHQ-9 Score 7 7 13 5 1   Difficult doing work/chores Not difficult at all Not difficult at all Somewhat difficult Somewhat difficult Not difficult at all   . GAD 7 : Generalized Anxiety Score 09/04/2020 11/15/2019 05/05/2019 06/23/2018  Nervous, Anxious, on Edge 0 0 1 0  Control/stop worrying 1 1 1 1   Worry too much - different things 1 1 1 1   Trouble relaxing 2 1 1  0  Restless 0 0 0 0  Easily annoyed or irritable 1 1 0 1  Afraid - awful might happen 2 0 1 0  Total GAD 7 Score 7 4 5  3  Anxiety Difficulty Not difficult at all Not difficult at all Somewhat difficult Not difficult at all           Assessment & Plan:  [name] KitchenMarland KitchenLeontae was seen today for adhd.  Diagnoses and all orders for this visit:  Attention deficit hyperactivity disorder (ADHD), predominantly inattentive type -     amphetamine-dextroamphetamine (ADDERALL) 10 MG tablet; Take one tablet at 2pm as needed. -     amphetamine-dextroamphetamine (ADDERALL) 10 MG tablet; Take 1 tablet (10 mg total) by mouth daily. At 2 pm PRN -     amphetamine-dextroamphetamine (ADDERALL) 10 MG tablet; Take 1 tablet (10 mg total) by mouth daily. At 2 pm PRN -     amphetamine-dextroamphetamine (ADDERALL XR) 30 MG 24 hr capsule; Take 1 capsule (30 mg total) by mouth every morning. -     amphetamine-dextroamphetamine (ADDERALL XR) 30 MG 24 hr capsule; Take 1 capsule (30 mg total) by mouth every  morning. -     amphetamine-dextroamphetamine (ADDERALL XR) 30 MG 24 hr capsule; Take 1 capsule (30 mg total) by mouth every morning.  Screening for diabetes mellitus -     COMPLETE METABOLIC PANEL WITH GFR  Screening for lipid disorders -     Lipid Panel w/reflex Direct LDL  Thyroid disorder screen -     TSH  Scoliosis of thoracolumbar spine, unspecified scoliosis type  Chronic mid back pain   Rechecked BP and much better on 2nd recheck.  Adderall refilled.  Follow up in 6 months.   Need for fasting labs. Ordered today.   Discussed ongoing pain. Reviewed chart and found evidence of ortho visit and scolosis.  DMV paperwork filled out. Discussed to walk when he could as walking is therapeutic. Continue symptomatic care.   PHQ and GAD stable. Pt declines any intervention.

## 2020-09-05 ENCOUNTER — Telehealth: Payer: Self-pay

## 2020-09-05 NOTE — Telephone Encounter (Signed)
PA sent for Adderall 10 and 30 mg. Awaiting response from insurance company.

## 2020-12-04 ENCOUNTER — Ambulatory Visit (INDEPENDENT_AMBULATORY_CARE_PROVIDER_SITE_OTHER): Payer: Commercial Managed Care - PPO | Admitting: Physician Assistant

## 2020-12-04 DIAGNOSIS — Z5329 Procedure and treatment not carried out because of patient's decision for other reasons: Secondary | ICD-10-CM

## 2020-12-04 NOTE — Progress Notes (Signed)
No show

## 2020-12-19 ENCOUNTER — Ambulatory Visit (INDEPENDENT_AMBULATORY_CARE_PROVIDER_SITE_OTHER): Payer: Commercial Managed Care - PPO | Admitting: Physician Assistant

## 2020-12-19 DIAGNOSIS — Z5329 Procedure and treatment not carried out because of patient's decision for other reasons: Secondary | ICD-10-CM

## 2020-12-19 NOTE — Progress Notes (Signed)
No show

## 2021-07-27 ENCOUNTER — Encounter: Payer: Self-pay | Admitting: Physician Assistant

## 2021-07-27 ENCOUNTER — Other Ambulatory Visit: Payer: Self-pay

## 2021-07-27 ENCOUNTER — Ambulatory Visit (INDEPENDENT_AMBULATORY_CARE_PROVIDER_SITE_OTHER): Payer: Self-pay | Admitting: Physician Assistant

## 2021-07-27 DIAGNOSIS — F900 Attention-deficit hyperactivity disorder, predominantly inattentive type: Secondary | ICD-10-CM

## 2021-07-27 MED ORDER — FLUOXETINE HCL 10 MG PO CAPS: 10.0000 mg | ORAL_CAPSULE | Freq: Every day | ORAL | 1 refills | Status: DC

## 2021-07-27 MED ORDER — AMPHETAMINE-DEXTROAMPHET ER 30 MG PO CP24: 30.0000 mg | ORAL_CAPSULE | ORAL | 0 refills | Status: DC

## 2021-07-27 NOTE — Patient Instructions (Signed)

## 2021-07-27 NOTE — Progress Notes (Signed)
? ?Subjective:  ? ? Patient ID: Michael Booker, male    DOB: 20-Feb-1991, 31 y.o.   MRN: [id number] ? ?HPI ? ?This is a well appearing 31 year old male with history of Adderall for ADHD who presents to the clinic to discuss mood. He has not been able to afford adderall due to losing his insurance. He has also noticed depressed mood and no motivation. Denies any SI/HC. Never tried any medication but just feels like he has felt like this for a while a more mild level. He would like to start medication.  ? ?.. ?Active Ambulatory Problems  ?  Diagnosis Date Noted  ? Attention deficit hyperactivity disorder (ADHD), predominantly inattentive type 06/02/2017  ? Childhood asthma 11/20/2018  ? Depressed mood 05/05/2019  ? Anxiety 05/05/2019  ? Stress at home 05/05/2019  ? Decreased libido 11/15/2019  ? Excessive daytime sleepiness 11/15/2019  ? No energy 11/15/2019  ? Scoliosis of thoracolumbar spine 09/04/2020  ? Chronic mid back pain 09/04/2020  ? ?Resolved Ambulatory Problems  ?  Diagnosis Date Noted  ? No Resolved Ambulatory Problems  ? ?No Additional Past Medical History  ? ? ? ?Review of Systems  ?Constitutional: Negative.  Negative for fever and unexpected weight change.  ?Gastrointestinal: Negative.   ?Musculoskeletal: Negative.   ?Psychiatric/Behavioral:  Positive for dysphoric mood. Negative for agitation and suicidal ideas.   ? ?   ?Objective:  ? Physical Exam ?Vitals reviewed.  ?Constitutional:   ?   General: He is not in acute distress. ?   Appearance: Normal appearance. He is normal weight.  ?HENT:  ?   Head: Normocephalic and atraumatic.  ?Neurological:  ?   Mental Status: He is alert and oriented to person, place, and time.  ? ?.. ?Depression screen [facility] 2/9 07/27/2021 09/04/2020 11/15/2019 05/05/2019 06/23/2018  ?Decreased Interest 3 1 1 2 1   ?Down, Depressed, Hopeless 2 1 0 2 1  ?PHQ - 2 Score 5 2 1 4 2   ?Altered sleeping 1 0 0 1 0  ?Tired, decreased energy 2 1 2 3 1   ?Change in appetite 0 1 1 2 1   ?Feeling bad or  failure about yourself  1 0 0 1 0  ?Trouble concentrating 3 3 3 1 1   ?Moving slowly or fidgety/restless 2 0 0 1 0  ?Suicidal thoughts 0 0 0 0 0  ?PHQ-9 Score 14 7 7 13 5   ?Difficult doing work/chores Somewhat difficult Not difficult at all Not difficult at all Somewhat difficult Somewhat difficult  ? ?.. ?GAD 7 : Generalized Anxiety Score 07/27/2021 09/04/2020 11/15/2019 05/05/2019  ?Nervous, Anxious, on Edge 2 0 0 1  ?Control/stop worrying 2 1 1 1   ?Worry too much - different things 2 1 1 1   ?Trouble relaxing 2 2 1 1   ?Restless 1 0 0 0  ?Easily annoyed or irritable 1 1 1  0  ?Afraid - awful might happen 1 2 0 1  ?Total GAD 7 Score 11 7 4 5   ?Anxiety Difficulty Somewhat difficult Not difficult at all Not difficult at all Somewhat difficult  ? ? ? ? ?   ?Assessment & Plan:  ? ?  was seen today for depression. ? ?Diagnoses and all orders for this visit: ? ?Attention deficit hyperactivity disorder (ADHD), predominantly inattentive type ?-     amphetamine-dextroamphetamine (ADDERALL XR) 30 MG 24 hr capsule; Take 1 capsule (30 mg total) by mouth every morning. ? ?Other orders ?-     FLUoxetine (PROZAC) 10 MG capsule; Take 1  capsule (10 mg total) by mouth daily. ? ?Educated patient on using GoodRx to ensure ability to afford necessary medications. ?Continue Adderall as prescribed. ?Start Fluoxetine 10mg  daily, may take as long as 4-6 weeks for noticeable improvement in depressive symptoms.  ?Follow up in 6-8 weeks to reevaluate or sooner if symptoms worsening or new SI/HI ?

## 2021-08-13 ENCOUNTER — Telehealth: Payer: Self-pay | Admitting: Neurology

## 2021-08-13 NOTE — Telephone Encounter (Signed)
Spoke with patient. He states Fluoxetine seems to be making him feel worse, especially when he takes it with his Adderall. He feels unmotivated to do anything. No SI/HI. He has been researching some and wondering if he can try something different like Effexor? Please advise.  ?

## 2021-08-13 NOTE — Telephone Encounter (Signed)
Patient left vm stating he feels like Fluoxetine is making mood worse. Wants to discuss. [phone]. ?

## 2021-08-14 MED ORDER — VENLAFAXINE HCL ER 37.5 MG PO CP24: 37.5000 mg | ORAL_CAPSULE | Freq: Every day | ORAL | 1 refills | Status: DC

## 2021-08-14 NOTE — Telephone Encounter (Signed)
Patient made aware and will stop Prozac and start Effexor. He will keep follow up on 09/10/21. ?

## 2021-09-10 ENCOUNTER — Encounter: Payer: Self-pay | Admitting: Physician Assistant

## 2021-09-10 ENCOUNTER — Ambulatory Visit: Payer: Self-pay | Admitting: Physician Assistant

## 2021-09-10 DIAGNOSIS — Z79899 Other long term (current) drug therapy: Secondary | ICD-10-CM

## 2021-09-10 DIAGNOSIS — Z13220 Encounter for screening for lipoid disorders: Secondary | ICD-10-CM

## 2021-09-10 DIAGNOSIS — Z131 Encounter for screening for diabetes mellitus: Secondary | ICD-10-CM

## 2021-09-10 DIAGNOSIS — Z1329 Encounter for screening for other suspected endocrine disorder: Secondary | ICD-10-CM

## 2021-09-10 NOTE — Telephone Encounter (Signed)
Appointment has been rescheduled by Galin. AMUCK ?

## 2021-09-11 ENCOUNTER — Other Ambulatory Visit: Payer: Self-pay | Admitting: Physician Assistant

## 2021-09-11 DIAGNOSIS — F900 Attention-deficit hyperactivity disorder, predominantly inattentive type: Secondary | ICD-10-CM

## 2021-09-11 MED ORDER — AMPHETAMINE-DEXTROAMPHET ER 30 MG PO CP24: 30.0000 mg | ORAL_CAPSULE | ORAL | 0 refills | Status: DC

## 2021-09-11 MED ORDER — AMPHETAMINE-DEXTROAMPHETAMINE 10 MG PO TABS: ORAL_TABLET | ORAL | 0 refills | Status: DC

## 2021-09-12 ENCOUNTER — Ambulatory Visit (INDEPENDENT_AMBULATORY_CARE_PROVIDER_SITE_OTHER): Payer: Self-pay | Admitting: Physician Assistant

## 2021-09-12 ENCOUNTER — Encounter: Payer: Self-pay | Admitting: Physician Assistant

## 2021-09-12 VITALS — BP 124/72 | HR 86 | Ht 68.0 in | Wt 191.0 lb

## 2021-09-12 DIAGNOSIS — Z79899 Other long term (current) drug therapy: Secondary | ICD-10-CM

## 2021-09-12 DIAGNOSIS — R4589 Other symptoms and signs involving emotional state: Secondary | ICD-10-CM

## 2021-09-12 DIAGNOSIS — F439 Reaction to severe stress, unspecified: Secondary | ICD-10-CM

## 2021-09-12 DIAGNOSIS — Z1329 Encounter for screening for other suspected endocrine disorder: Secondary | ICD-10-CM

## 2021-09-12 DIAGNOSIS — R6882 Decreased libido: Secondary | ICD-10-CM

## 2021-09-12 DIAGNOSIS — R5383 Other fatigue: Secondary | ICD-10-CM

## 2021-09-12 DIAGNOSIS — Z13220 Encounter for screening for lipoid disorders: Secondary | ICD-10-CM

## 2021-09-12 DIAGNOSIS — F419 Anxiety disorder, unspecified: Secondary | ICD-10-CM

## 2021-09-12 DIAGNOSIS — Z131 Encounter for screening for diabetes mellitus: Secondary | ICD-10-CM

## 2021-09-12 DIAGNOSIS — F900 Attention-deficit hyperactivity disorder, predominantly inattentive type: Secondary | ICD-10-CM

## 2021-09-12 MED ORDER — VENLAFAXINE HCL ER 75 MG PO CP24: 75.0000 mg | ORAL_CAPSULE | Freq: Every day | ORAL | 1 refills | Status: DC

## 2021-09-12 NOTE — Progress Notes (Signed)
? ?Established Patient Office Visit ? ?Subjective   ?Patient ID: Michael Booker, male    DOB: 1990/08/24  Age: 31 y.o. MRN: [id number] ? ?Chief Complaint  ?Patient presents with  ? Follow-up  ? Depression  ? ? ?HPI ?Pt is a 31 yo male who presents to the clinic to follow up on depression. He did not like prozac at all. He does feel like effexor has helped a lot. He is still not where he wants to be. He is still struggling with no motivation and depressed mood. No SI/HC.  ? ?.. ?Active Ambulatory Problems  ?  Diagnosis Date Noted  ? Attention deficit hyperactivity disorder (ADHD), predominantly inattentive type 06/02/2017  ? Childhood asthma 11/20/2018  ? Depressed mood 05/05/2019  ? Anxiety 05/05/2019  ? Stress at home 05/05/2019  ? Decreased libido 11/15/2019  ? Excessive daytime sleepiness 11/15/2019  ? No energy 11/15/2019  ? Scoliosis of thoracolumbar spine 09/04/2020  ? Chronic mid back pain 09/04/2020  ? ?Resolved Ambulatory Problems  ?  Diagnosis Date Noted  ? No Resolved Ambulatory Problems  ? ?No Additional Past Medical History  ? ? ? ?Review of Systems  ?All other systems reviewed and are negative. ? ?  ?Objective:  ?  ? ?BP 124/72   Pulse 86   Ht 5\' 8"  (1.727 m)   Wt 191 lb (86.6 kg)   SpO2 97%   BMI 29.04 kg/m?  ?BP Readings from Last 3 Encounters:  ?09/12/21 124/72  ?07/27/21 135/77  ?09/04/20 114/72  ? ?  ? ?Physical Exam ?Vitals reviewed.  ?Constitutional:   ?   Appearance: Normal appearance.  ?Cardiovascular:  ?   Rate and Rhythm: Normal rate and regular rhythm.  ?   Pulses: Normal pulses.  ?   Heart sounds: Normal heart sounds.  ?Pulmonary:  ?   Effort: Pulmonary effort is normal.  ?   Breath sounds: Normal breath sounds.  ?Neurological:  ?   General: No focal deficit present.  ?   Mental Status: He is alert and oriented to person, place, and time.  ?Psychiatric:     ?   Mood and Affect: Mood normal.  ? ? ? ?.. ? ?  09/12/2021  ?  2:49 PM 07/27/2021  ? 11:34 AM 09/04/2020  ?  1:34 PM 11/15/2019  ?  1:21  PM 05/05/2019  ?  9:48 AM  ?Depression screen PHQ 2/9  ?Decreased Interest 1 3 1 1 2   ?Down, Depressed, Hopeless 1 2 1  0 2  ?PHQ - 2 Score 2 5 2 1 4   ?Altered sleeping 0 1 0 0 1  ?Tired, decreased energy 2 2 1 2 3   ?Change in appetite 1 0 1 1 2   ?Feeling bad or failure about yourself  1 1 0 0 1  ?Trouble concentrating 1 3 3 3 1   ?Moving slowly or fidgety/restless 0 2 0 0 1  ?Suicidal thoughts 0 0 0 0 0  ?PHQ-9 Score 7 14 7 7 13   ?Difficult doing work/chores Somewhat difficult Somewhat difficult Not difficult at all Not difficult at all Somewhat difficult  ? ?.. ? ?  09/12/2021  ?  2:50 PM 07/27/2021  ? 11:35 AM 09/04/2020  ?  1:35 PM 11/15/2019  ?  1:24 PM  ?GAD 7 : Generalized Anxiety Score  ?Nervous, Anxious, on Edge 1 2 0 0  ?Control/stop worrying 0 2 1 1   ?Worry too much - different things 1 2 1 1   ?Trouble relaxing 0 2 2  1  ?Restless 0 1 0 0  ?Easily annoyed or irritable 1 1 1 1   ?Afraid - awful might happen 0 1 2 0  ?Total GAD 7 Score 3 11 7 4   ?Anxiety Difficulty Somewhat difficult Somewhat difficult Not difficult at all Not difficult at all  ? ? ? ?  ?Assessment & Plan:  ?..Esco was seen today for follow-up and depression. ? ?Diagnoses and all orders for this visit: ? ?Attention deficit hyperactivity disorder (ADHD), predominantly inattentive type ? ?Screening for diabetes mellitus ?-     COMPLETE METABOLIC PANEL WITH GFR ? ?Screening for lipid disorders ?-     Lipid Panel w/reflex Direct LDL ? ?Thyroid disorder screen ?-     TSH ? ?Medication management ?-     TSH ?-     Lipid Panel w/reflex Direct LDL ?-     COMPLETE METABOLIC PANEL WITH GFR ?-     CBC with Differential/Platelet ? ?No energy ?-     Testosterone Total,Free,Bio, Males ?-     B12 and Folate Panel ?-     VITAMIN D 25 Hydroxy (Vit-D Deficiency, Fractures) ? ?Decreased libido ? ?Depressed mood ?-     venlafaxine XR (EFFEXOR XR) 75 MG 24 hr capsule; Take 1 capsule (75 mg total) by mouth daily with breakfast. ? ?Anxiety ?-     venlafaxine XR (EFFEXOR  XR) 75 MG 24 hr capsule; Take 1 capsule (75 mg total) by mouth daily with breakfast. ? ?Stress at home ?-     venlafaxine XR (EFFEXOR XR) 75 MG 24 hr capsule; Take 1 capsule (75 mg total) by mouth daily with breakfast. ? ? ?Pt needs fasting labs for screening ? ?PHQ and GAD numbers improved quite a bit ?Increased effexor to 75mg  daily ?Continue adderall ?Follow up in 3 months ?Return in about 3 months (around 12/12/2021).  ? ? ?Iran Planas, PA-C ? ?

## 2021-09-14 ENCOUNTER — Encounter: Payer: Self-pay | Admitting: Physician Assistant

## 2021-12-12 ENCOUNTER — Ambulatory Visit (INDEPENDENT_AMBULATORY_CARE_PROVIDER_SITE_OTHER): Payer: Self-pay | Admitting: Physician Assistant

## 2021-12-12 ENCOUNTER — Encounter: Payer: Self-pay | Admitting: Physician Assistant

## 2021-12-12 VITALS — BP 105/62 | HR 79 | Ht 68.0 in | Wt 190.0 lb

## 2021-12-12 DIAGNOSIS — F419 Anxiety disorder, unspecified: Secondary | ICD-10-CM

## 2021-12-12 DIAGNOSIS — F900 Attention-deficit hyperactivity disorder, predominantly inattentive type: Secondary | ICD-10-CM

## 2021-12-12 DIAGNOSIS — R4589 Other symptoms and signs involving emotional state: Secondary | ICD-10-CM

## 2021-12-12 MED ORDER — AMPHETAMINE-DEXTROAMPHET ER 30 MG PO CP24: 30.0000 mg | ORAL_CAPSULE | ORAL | 0 refills | Status: DC

## 2021-12-12 MED ORDER — AMPHETAMINE-DEXTROAMPHETAMINE 10 MG PO TABS: 10.0000 mg | ORAL_TABLET | Freq: Every day | ORAL | 0 refills | Status: DC

## 2021-12-12 MED ORDER — AMPHETAMINE-DEXTROAMPHETAMINE 10 MG PO TABS: ORAL_TABLET | ORAL | 0 refills | Status: DC

## 2021-12-12 NOTE — Progress Notes (Signed)
Established Patient Office Visit  Subjective   Patient ID: Michael Booker, male    DOB: January 27, 1991  Age: 31 y.o. MRN: [id number]  Chief Complaint  Patient presents with   ADHD    HPI Pt is a 31 yo male with ADHD, GAD, Depressed mood who presents to the clinic for refills.   Pt is doing great. No concerns. Medications and working. No increase in insomnia, anxiety, headaches. No SI/HC.   [name]. Active Ambulatory Problems    Diagnosis Date Noted   Attention deficit hyperactivity disorder (ADHD), predominantly inattentive type 06/02/2017   Childhood asthma 11/20/2018   Depressed mood 05/05/2019   Anxiety 05/05/2019   Stress at home 05/05/2019   Decreased libido 11/15/2019   Excessive daytime sleepiness 11/15/2019   No energy 11/15/2019   Scoliosis of thoracolumbar spine 09/04/2020   Chronic mid back pain 09/04/2020   Resolved Ambulatory Problems    Diagnosis Date Noted   No Resolved Ambulatory Problems   No Additional Past Medical History     Review of Systems  All other systems reviewed and are negative.     Objective:     BP 105/62 (BP Location: Left Arm)   Pulse 79   Ht 5\' 8"  (1.727 m)   Wt 190 lb (86.2 kg)   BMI 28.89 kg/m  BP Readings from Last 3 Encounters:  12/12/21 105/62  09/12/21 124/72  07/27/21 135/77   .09/26/21    12/12/2021    3:57 PM 12/12/2021    3:28 PM 09/12/2021    2:49 PM 07/27/2021   11:34 AM 09/04/2020    1:34 PM  Depression screen PHQ 2/9  Decreased Interest 0 0 1 3 1   Down, Depressed, Hopeless 0 0 1 2 1   PHQ - 2 Score 0 0 2 5 2   Altered sleeping 1  0 1 0  Tired, decreased energy 1  2 2 1   Change in appetite 0  1 0 1  Feeling bad or failure about yourself  0  1 1 0  Trouble concentrating 1  1 3 3   Moving slowly or fidgety/restless 0  0 2 0  Suicidal thoughts 0  0 0 0  PHQ-9 Score 3  7 14 7   Difficult doing work/chores Not difficult at all  Somewhat difficult Somewhat difficult Not difficult at all   .4/9    12/12/2021    3:57 PM  09/12/2021    2:50 PM 07/27/2021   11:35 AM 09/04/2020    1:35 PM  GAD 7 : Generalized Anxiety Score  Nervous, Anxious, on Edge 0 1 2 0  Control/stop worrying 0 0 2 1  Worry too much - different things 1 1 2 1   Trouble relaxing 0 0 2 2  Restless 0 0 1 0  Easily annoyed or irritable 0 1 1 1   Afraid - awful might happen 0 0 1 2  Total GAD 7 Score 1 3 11 7   Anxiety Difficulty Not difficult at all Somewhat difficult Somewhat difficult Not difficult at all        Physical Exam Constitutional:      Appearance: Normal appearance.  Cardiovascular:     Rate and Rhythm: Normal rate and regular rhythm.     Heart sounds: Normal heart sounds.  Pulmonary:     Effort: Pulmonary effort is normal.  Neurological:     Mental Status: He is alert.  Psychiatric:        Mood and Affect: Mood normal.  Assessment & Plan:   [name] KitchenMarland KitchenDeddrick was seen today for adhd.  Diagnoses and all orders for this visit:  Depressed mood  Attention deficit hyperactivity disorder (ADHD), predominantly inattentive type -     amphetamine-dextroamphetamine (ADDERALL XR) 30 MG 24 hr capsule; Take 1 capsule (30 mg total) by mouth every morning. -     amphetamine-dextroamphetamine (ADDERALL XR) 30 MG 24 hr capsule; Take 1 capsule (30 mg total) by mouth every morning. -     amphetamine-dextroamphetamine (ADDERALL XR) 30 MG 24 hr capsule; Take 1 capsule (30 mg total) by mouth every morning. -     amphetamine-dextroamphetamine (ADDERALL) 10 MG tablet; Take 1 tablet (10 mg total) by mouth daily. At 2 pm PRN -     amphetamine-dextroamphetamine (ADDERALL) 10 MG tablet; Take one tablet at 2pm as needed. -     amphetamine-dextroamphetamine (ADDERALL) 10 MG tablet; Take one tablet at 2pm daily.  Anxiety   Doing great with focus and mood. No concerns. Vitals look great.  Refilled for 3 months.  Ok to follow up in 6 months.    Return in about 6 months (around 06/14/2022).    Tandy Gaw, PA-C

## 2021-12-13 ENCOUNTER — Encounter: Payer: Self-pay | Admitting: Physician Assistant

## 2022-02-03 ENCOUNTER — Other Ambulatory Visit: Payer: Self-pay | Admitting: Physician Assistant

## 2022-02-03 DIAGNOSIS — F900 Attention-deficit hyperactivity disorder, predominantly inattentive type: Secondary | ICD-10-CM

## 2022-02-06 ENCOUNTER — Encounter: Payer: Self-pay | Admitting: Physician Assistant

## 2022-02-06 ENCOUNTER — Other Ambulatory Visit: Payer: Self-pay | Admitting: Physician Assistant

## 2022-02-06 DIAGNOSIS — F900 Attention-deficit hyperactivity disorder, predominantly inattentive type: Secondary | ICD-10-CM

## 2022-02-06 MED ORDER — AMPHETAMINE-DEXTROAMPHET ER 30 MG PO CP24: 30.0000 mg | ORAL_CAPSULE | ORAL | 0 refills | Status: DC

## 2022-02-06 NOTE — Telephone Encounter (Signed)
Duplicate request for adderall rx from [facility] pharmacy.

## 2022-03-02 ENCOUNTER — Other Ambulatory Visit: Payer: Self-pay | Admitting: Physician Assistant

## 2022-03-02 DIAGNOSIS — F439 Reaction to severe stress, unspecified: Secondary | ICD-10-CM

## 2022-03-02 DIAGNOSIS — R4589 Other symptoms and signs involving emotional state: Secondary | ICD-10-CM

## 2022-03-02 DIAGNOSIS — F419 Anxiety disorder, unspecified: Secondary | ICD-10-CM

## 2022-03-20 ENCOUNTER — Encounter: Payer: Self-pay | Admitting: Physician Assistant

## 2022-03-20 ENCOUNTER — Other Ambulatory Visit: Payer: Self-pay | Admitting: Physician Assistant

## 2022-03-20 DIAGNOSIS — F900 Attention-deficit hyperactivity disorder, predominantly inattentive type: Secondary | ICD-10-CM

## 2022-04-23 ENCOUNTER — Other Ambulatory Visit: Payer: Self-pay | Admitting: Physician Assistant

## 2022-04-23 DIAGNOSIS — F900 Attention-deficit hyperactivity disorder, predominantly inattentive type: Secondary | ICD-10-CM

## 2022-04-24 MED ORDER — AMPHETAMINE-DEXTROAMPHET ER 30 MG PO CP24: 30.0000 mg | ORAL_CAPSULE | ORAL | 0 refills | Status: DC

## 2022-06-03 ENCOUNTER — Other Ambulatory Visit: Payer: Self-pay | Admitting: Family Medicine

## 2022-06-03 DIAGNOSIS — F900 Attention-deficit hyperactivity disorder, predominantly inattentive type: Secondary | ICD-10-CM

## 2022-06-04 MED ORDER — AMPHETAMINE-DEXTROAMPHET ER 30 MG PO CP24: 30.0000 mg | ORAL_CAPSULE | ORAL | 0 refills | Status: DC

## 2022-06-14 ENCOUNTER — Encounter: Payer: Self-pay | Admitting: Physician Assistant

## 2022-06-14 ENCOUNTER — Ambulatory Visit (INDEPENDENT_AMBULATORY_CARE_PROVIDER_SITE_OTHER): Payer: Self-pay | Admitting: Physician Assistant

## 2022-06-14 ENCOUNTER — Ambulatory Visit: Payer: Self-pay | Admitting: Physician Assistant

## 2022-06-14 VITALS — BP 120/78 | HR 94 | Ht 68.0 in | Wt 199.0 lb

## 2022-06-14 DIAGNOSIS — F419 Anxiety disorder, unspecified: Secondary | ICD-10-CM

## 2022-06-14 DIAGNOSIS — R4589 Other symptoms and signs involving emotional state: Secondary | ICD-10-CM

## 2022-06-14 DIAGNOSIS — F900 Attention-deficit hyperactivity disorder, predominantly inattentive type: Secondary | ICD-10-CM

## 2022-06-14 DIAGNOSIS — F439 Reaction to severe stress, unspecified: Secondary | ICD-10-CM

## 2022-06-14 MED ORDER — AMPHETAMINE-DEXTROAMPHET ER 30 MG PO CP24: 30.0000 mg | ORAL_CAPSULE | ORAL | 0 refills | Status: DC

## 2022-06-14 MED ORDER — AMPHETAMINE-DEXTROAMPHETAMINE 10 MG PO TABS: ORAL_TABLET | ORAL | 0 refills | Status: DC

## 2022-06-14 MED ORDER — VENLAFAXINE HCL ER 75 MG PO CP24: 75.0000 mg | ORAL_CAPSULE | Freq: Every day | ORAL | 1 refills | Status: DC

## 2022-06-14 MED ORDER — AMPHETAMINE-DEXTROAMPHETAMINE 10 MG PO TABS: 10.0000 mg | ORAL_TABLET | Freq: Every day | ORAL | 0 refills | Status: DC

## 2022-06-14 NOTE — Progress Notes (Signed)
Established Patient Office Visit  Subjective   Patient ID: Michael Booker, male    DOB: 08-29-1990  Age: 32 y.o. MRN: [id number]  Chief Complaint  Patient presents with   ADHD    HPI Pt is a 32 yo male with ADHD, Depressed mood and anxiety who presents to the clinic for 6 month refills.   He is doing well. No major concerns. His job and mood are great.   .. Active Ambulatory Problems    Diagnosis Date Noted   Attention deficit hyperactivity disorder (ADHD), predominantly inattentive type 06/02/2017   Childhood asthma 11/20/2018   Depressed mood 05/05/2019   Anxiety 05/05/2019   Stress at home 05/05/2019   Decreased libido 11/15/2019   Excessive daytime sleepiness 11/15/2019   No energy 11/15/2019   Scoliosis of thoracolumbar spine 09/04/2020   Chronic mid back pain 09/04/2020   Resolved Ambulatory Problems    Diagnosis Date Noted   No Resolved Ambulatory Problems   No Additional Past Medical History    ROS See HPI.    Objective:     BP 120/78   Pulse 94   Ht 5\' 8"  (1.727 m)   Wt 199 lb (90.3 kg)   SpO2 97%   BMI 30.26 kg/m  BP Readings from Last 3 Encounters:  06/14/22 120/78  12/12/21 105/62  09/12/21 124/72   Wt Readings from Last 3 Encounters:  06/14/22 199 lb (90.3 kg)  12/12/21 190 lb (86.2 kg)  09/12/21 191 lb (86.6 kg)    ..    06/14/2022   11:20 AM 12/12/2021    3:57 PM 12/12/2021    3:28 PM 09/12/2021    2:49 PM 07/27/2021   11:34 AM  Depression screen PHQ 2/9  Decreased Interest 1 0 0 1 3  Down, Depressed, Hopeless 0 0 0 1 2  PHQ - 2 Score 1 0 0 2 5  Altered sleeping 0 1  0 1  Tired, decreased energy 1 1  2 2   Change in appetite 1 0  1 0  Feeling bad or failure about yourself  0 0  1 1  Trouble concentrating 0 1  1 3   Moving slowly or fidgety/restless 0 0  0 2  Suicidal thoughts 0 0  0 0  PHQ-9 Score 3 3  7 14   Difficult doing work/chores Not difficult at all Not difficult at all  Somewhat difficult Somewhat difficult   .[name]     06/14/2022   11:26 AM 12/12/2021    3:57 PM 09/12/2021    2:50 PM 07/27/2021   11:35 AM  GAD 7 : Generalized Anxiety Score  Nervous, Anxious, on Edge 0 0 1 2  Control/stop worrying 0 0 0 2  Worry too much - different things 0 1 1 2   Trouble relaxing 0 0 0 2  Restless 0 0 0 1  Easily annoyed or irritable 0 0 1 1  Afraid - awful might happen 0 0 0 1  Total GAD 7 Score 0 1 3 11   Anxiety Difficulty Not difficult at all Not difficult at all Somewhat difficult Somewhat difficult      Physical Exam Constitutional:      Appearance: Normal appearance.  Cardiovascular:     Rate and Rhythm: Normal rate and regular rhythm.  Pulmonary:     Effort: Pulmonary effort is normal.  Neurological:     General: No focal deficit present.     Mental Status: He is alert and oriented to person, place, and  time.  Psychiatric:        Mood and Affect: Mood normal.        Assessment & Plan:  [name] KitchenMarland KitchenJean was seen today for adhd.  Diagnoses and all orders for this visit:  Attention deficit hyperactivity disorder (ADHD), predominantly inattentive type -     amphetamine-dextroamphetamine (ADDERALL XR) 30 MG 24 hr capsule; Take 1 capsule (30 mg total) by mouth every morning. -     amphetamine-dextroamphetamine (ADDERALL) 10 MG tablet; Take 1 tablet (10 mg total) by mouth daily. At 2 pm PRN -     amphetamine-dextroamphetamine (ADDERALL) 10 MG tablet; Take one tablet at 2pm as needed. -     amphetamine-dextroamphetamine (ADDERALL) 10 MG tablet; Take one tablet at 2pm daily. -     amphetamine-dextroamphetamine (ADDERALL XR) 30 MG 24 hr capsule; Take 1 capsule (30 mg total) by mouth every morning. -     amphetamine-dextroamphetamine (ADDERALL XR) 30 MG 24 hr capsule; Take 1 capsule (30 mg total) by mouth every morning.  Anxiety -     venlafaxine XR (EFFEXOR-XR) 75 MG 24 hr capsule; Take 1 capsule (75 mg total) by mouth daily with breakfast.  Stress at home -     venlafaxine XR (EFFEXOR-XR) 75 MG 24 hr capsule;  Take 1 capsule (75 mg total) by mouth daily with breakfast.  Depressed mood -     venlafaxine XR (EFFEXOR-XR) 75 MG 24 hr capsule; Take 1 capsule (75 mg total) by mouth daily with breakfast.   PHQ/GAD look great.  Vitals look great.  Refilled medications Printed lab order to get fasting labs.   Return in about 6 months (around 12/13/2022).    Iran Planas, PA-C

## 2022-09-10 ENCOUNTER — Encounter: Payer: Self-pay | Admitting: Physician Assistant

## 2022-09-10 ENCOUNTER — Other Ambulatory Visit: Payer: Self-pay | Admitting: Physician Assistant

## 2022-09-10 DIAGNOSIS — F900 Attention-deficit hyperactivity disorder, predominantly inattentive type: Secondary | ICD-10-CM

## 2022-09-10 MED ORDER — AMPHETAMINE-DEXTROAMPHETAMINE 10 MG PO TABS: 10.0000 mg | ORAL_TABLET | Freq: Every day | ORAL | 0 refills | Status: DC

## 2022-09-10 MED ORDER — BUPROPION HCL ER (XL) 150 MG PO TB24: 150.0000 mg | ORAL_TABLET | Freq: Every day | ORAL | 0 refills | Status: DC

## 2022-09-10 MED ORDER — AMPHETAMINE-DEXTROAMPHET ER 30 MG PO CP24: 30.0000 mg | ORAL_CAPSULE | ORAL | 0 refills | Status: DC

## 2022-09-10 MED ORDER — VENLAFAXINE HCL ER 37.5 MG PO CP24: ORAL_CAPSULE | ORAL | 0 refills | Status: DC

## 2022-10-04 ENCOUNTER — Other Ambulatory Visit: Payer: Self-pay | Admitting: Physician Assistant

## 2022-10-04 DIAGNOSIS — F900 Attention-deficit hyperactivity disorder, predominantly inattentive type: Secondary | ICD-10-CM

## 2022-10-06 ENCOUNTER — Encounter: Payer: Self-pay | Admitting: Physician Assistant

## 2022-10-07 MED ORDER — BUPROPION HCL ER (XL) 300 MG PO TB24: 300.0000 mg | ORAL_TABLET | Freq: Every day | ORAL | 0 refills | Status: DC

## 2022-11-10 ENCOUNTER — Other Ambulatory Visit: Payer: Self-pay | Admitting: Physician Assistant

## 2022-11-10 DIAGNOSIS — F900 Attention-deficit hyperactivity disorder, predominantly inattentive type: Secondary | ICD-10-CM

## 2022-11-11 MED ORDER — AMPHETAMINE-DEXTROAMPHETAMINE 10 MG PO TABS: ORAL_TABLET | ORAL | 0 refills | Status: DC

## 2022-11-11 MED ORDER — AMPHETAMINE-DEXTROAMPHET ER 30 MG PO CP24: 30.0000 mg | ORAL_CAPSULE | ORAL | 0 refills | Status: DC

## 2022-11-11 NOTE — Telephone Encounter (Signed)
..  PDMP reviewed during this encounter. Needs appt for more refills One more month given.

## 2022-12-11 ENCOUNTER — Other Ambulatory Visit: Payer: Self-pay | Admitting: Physician Assistant

## 2022-12-11 DIAGNOSIS — F900 Attention-deficit hyperactivity disorder, predominantly inattentive type: Secondary | ICD-10-CM

## 2022-12-13 ENCOUNTER — Ambulatory Visit: Payer: Self-pay | Admitting: Physician Assistant

## 2022-12-16 ENCOUNTER — Ambulatory Visit: Payer: Managed Care, Other (non HMO) | Admitting: Physician Assistant

## 2022-12-16 ENCOUNTER — Encounter: Payer: Self-pay | Admitting: Physician Assistant

## 2022-12-16 VITALS — BP 114/73 | HR 86 | Ht 68.0 in | Wt 196.0 lb

## 2022-12-16 DIAGNOSIS — R4589 Other symptoms and signs involving emotional state: Secondary | ICD-10-CM | POA: Diagnosis not present

## 2022-12-16 DIAGNOSIS — F900 Attention-deficit hyperactivity disorder, predominantly inattentive type: Secondary | ICD-10-CM | POA: Diagnosis not present

## 2022-12-16 DIAGNOSIS — F419 Anxiety disorder, unspecified: Secondary | ICD-10-CM

## 2022-12-16 MED ORDER — AMPHETAMINE-DEXTROAMPHET ER 30 MG PO CP24
30.0000 mg | ORAL_CAPSULE | ORAL | 0 refills | Status: DC
Start: 2023-01-16 — End: 2023-09-16

## 2022-12-16 MED ORDER — BUPROPION HCL ER (XL) 300 MG PO TB24
300.0000 mg | ORAL_TABLET | Freq: Every day | ORAL | 0 refills | Status: DC
Start: 2022-12-16 — End: 2023-03-18

## 2022-12-16 MED ORDER — AMPHETAMINE-DEXTROAMPHET ER 30 MG PO CP24
30.0000 mg | ORAL_CAPSULE | ORAL | 0 refills | Status: DC
Start: 2023-02-16 — End: 2023-09-16

## 2022-12-16 MED ORDER — AMPHETAMINE-DEXTROAMPHETAMINE 20 MG PO TABS
ORAL_TABLET | ORAL | 0 refills | Status: DC
Start: 2023-01-16 — End: 2023-09-16

## 2022-12-16 MED ORDER — AMPHETAMINE-DEXTROAMPHETAMINE 20 MG PO TABS: ORAL_TABLET | ORAL | 0 refills | Status: DC

## 2022-12-16 MED ORDER — AMPHETAMINE-DEXTROAMPHET ER 30 MG PO CP24
30.0000 mg | ORAL_CAPSULE | ORAL | 0 refills | Status: DC
Start: 2022-12-16 — End: 2023-09-16

## 2022-12-16 MED ORDER — AMPHETAMINE-DEXTROAMPHETAMINE 20 MG PO TABS
ORAL_TABLET | ORAL | 0 refills | Status: DC
Start: 2022-12-16 — End: 2023-09-16

## 2022-12-16 NOTE — Progress Notes (Addendum)
Established Patient Office Visit  Subjective   Patient ID: Michael Booker, male    DOB: 05-Jan-1991  Age: 32 y.o. MRN: [id number]  Chief Complaint  Patient presents with   ADHD    Patient feels he has developed a tolerance to current strength of Adderall - requesting to increase strength.    Depression    Patient is currently using bupropion but is not using venlafaxine XR.     HPI Pt is a 32 yo male with ADHD, MDD, and GAD who presents to the clinic to follow up.   He feels like he is not as focused as he once was with adderall. He finds it wearing off sooner and sooner. IR tablet does help some. Mood is good. Work is good except losing focus and not being task oriented. No SI/HC.   [name]. Active Ambulatory Problems    Diagnosis Date Noted   Attention deficit hyperactivity disorder (ADHD), predominantly inattentive type 06/02/2017   Childhood asthma 11/20/2018   Depressed mood 05/05/2019   Anxiety 05/05/2019   Stress at home 05/05/2019   Decreased libido 11/15/2019   Excessive daytime sleepiness 11/15/2019   No energy 11/15/2019   Scoliosis of thoracolumbar spine 09/04/2020   Chronic mid back pain 09/04/2020   Resolved Ambulatory Problems    Diagnosis Date Noted   No Resolved Ambulatory Problems   No Additional Past Medical History     ROS See HPI.    Objective:     BP 114/73   Pulse 86   Ht 5\' 8"  (1.727 m)   Wt 196 lb (88.9 kg)   SpO2 97%   BMI 29.80 kg/m  BP Readings from Last 3 Encounters:  12/16/22 114/73  06/14/22 120/78  12/12/21 105/62   Wt Readings from Last 3 Encounters:  12/16/22 196 lb (88.9 kg)  06/14/22 199 lb (90.3 kg)  12/12/21 190 lb (86.2 kg)    ..    12/16/2022    2:17 PM 06/14/2022   11:20 AM 12/12/2021    3:57 PM 12/12/2021    3:28 PM 09/12/2021    2:49 PM  Depression screen PHQ 2/9  Decreased Interest 1 1 0 0 1  Down, Depressed, Hopeless 0 0 0 0 1  PHQ - 2 Score 1 1 0 0 2  Altered sleeping 1 0 1  0  Tired, decreased energy 1 1 1   2   Change in appetite 0 1 0  1  Feeling bad or failure about yourself  0 0 0  1  Trouble concentrating 0 0 1  1  Moving slowly or fidgety/restless 0 0 0  0  Suicidal thoughts 0 0 0  0  PHQ-9 Score 3 3 3  7   Difficult doing work/chores Not difficult at all Not difficult at all Not difficult at all  Somewhat difficult   ..    12/16/2022    2:18 PM 06/14/2022   11:26 AM 12/12/2021    3:57 PM 09/12/2021    2:50 PM  GAD 7 : Generalized Anxiety Score  Nervous, Anxious, on Edge 0 0 0 1  Control/stop worrying 0 0 0 0  Worry too much - different things 0 0 1 1  Trouble relaxing 0 0 0 0  Restless 0 0 0 0  Easily annoyed or irritable 0 0 0 1  Afraid - awful might happen 0 0 0 0  Total GAD 7 Score 0 0 1 3  Anxiety Difficulty Not difficult at all Not difficult at  all Not difficult at all Somewhat difficult      Physical Exam Constitutional:      Appearance: Normal appearance. He is obese.  HENT:     Head: Normocephalic.  Cardiovascular:     Rate and Rhythm: Normal rate and regular rhythm.     Pulses: Normal pulses.     Heart sounds: Normal heart sounds.  Pulmonary:     Effort: Pulmonary effort is normal.     Breath sounds: Normal breath sounds.  Musculoskeletal:     Right lower leg: No edema.     Left lower leg: No edema.  Neurological:     General: No focal deficit present.     Mental Status: He is alert and oriented to person, place, and time.  Psychiatric:        Mood and Affect: Mood normal.       Assessment & Plan:  [name] KitchenMarland KitchenAdhvik was seen today for adhd and depression.  Diagnoses and all orders for this visit:  Attention deficit hyperactivity disorder (ADHD), predominantly inattentive type -     amphetamine-dextroamphetamine (ADDERALL XR) 30 MG 24 hr capsule; Take 1 capsule (30 mg total) by mouth every morning. -     amphetamine-dextroamphetamine (ADDERALL XR) 30 MG 24 hr capsule; Take 1 capsule (30 mg total) by mouth every morning. -     amphetamine-dextroamphetamine  (ADDERALL XR) 30 MG 24 hr capsule; Take 1 capsule (30 mg total) by mouth every morning. -     amphetamine-dextroamphetamine (ADDERALL) 20 MG tablet; Take one tablet daily at 2pm. -     amphetamine-dextroamphetamine (ADDERALL) 20 MG tablet; Take one tablet daily at 2pm. -     amphetamine-dextroamphetamine (ADDERALL) 20 MG tablet; Take one tablet at 2pm daily. -     buPROPion (WELLBUTRIN XL) 300 MG 24 hr tablet; Take 1 tablet (300 mg total) by mouth daily.  Depressed mood -     buPROPion (WELLBUTRIN XL) 300 MG 24 hr tablet; Take 1 tablet (300 mg total) by mouth daily.  Anxiety -     buPROPion (WELLBUTRIN XL) 300 MG 24 hr tablet; Take 1 tablet (300 mg total) by mouth daily.   PHQ and GAD look good.  [name] KitchenMarland KitchenPDMP reviewed during this encounter.  Continue wellbutrin Vitals look good.  Increased adderall XR 30 and 20mg  IR at 2pm.  Follow up in 3 months  Return in about 3 months (around 03/18/2023).    Tandy Gaw, PA-C

## 2023-01-14 ENCOUNTER — Other Ambulatory Visit: Payer: Self-pay | Admitting: Physician Assistant

## 2023-01-14 DIAGNOSIS — F419 Anxiety disorder, unspecified: Secondary | ICD-10-CM

## 2023-01-14 DIAGNOSIS — R4589 Other symptoms and signs involving emotional state: Secondary | ICD-10-CM

## 2023-01-14 DIAGNOSIS — F900 Attention-deficit hyperactivity disorder, predominantly inattentive type: Secondary | ICD-10-CM

## 2023-02-10 ENCOUNTER — Other Ambulatory Visit: Payer: Self-pay | Admitting: Physician Assistant

## 2023-02-10 DIAGNOSIS — F900 Attention-deficit hyperactivity disorder, predominantly inattentive type: Secondary | ICD-10-CM

## 2023-03-18 ENCOUNTER — Ambulatory Visit (INDEPENDENT_AMBULATORY_CARE_PROVIDER_SITE_OTHER): Payer: Managed Care, Other (non HMO) | Admitting: Physician Assistant

## 2023-03-18 ENCOUNTER — Encounter: Payer: Self-pay | Admitting: Physician Assistant

## 2023-03-18 VITALS — BP 120/80 | HR 96 | Ht 68.0 in | Wt 192.0 lb

## 2023-03-18 DIAGNOSIS — F419 Anxiety disorder, unspecified: Secondary | ICD-10-CM

## 2023-03-18 DIAGNOSIS — F900 Attention-deficit hyperactivity disorder, predominantly inattentive type: Secondary | ICD-10-CM

## 2023-03-18 DIAGNOSIS — R4589 Other symptoms and signs involving emotional state: Secondary | ICD-10-CM | POA: Diagnosis not present

## 2023-03-18 MED ORDER — AMPHETAMINE-DEXTROAMPHET ER 30 MG PO CP24: 30.0000 mg | ORAL_CAPSULE | ORAL | 0 refills | Status: DC

## 2023-03-18 MED ORDER — AMPHETAMINE-DEXTROAMPHETAMINE 20 MG PO TABS: ORAL_TABLET | ORAL | 0 refills | Status: DC

## 2023-03-18 MED ORDER — AMPHETAMINE-DEXTROAMPHET ER 30 MG PO CP24
30.0000 mg | ORAL_CAPSULE | ORAL | 0 refills | Status: DC
Start: 2023-04-17 — End: 2023-09-16

## 2023-03-18 MED ORDER — BUPROPION HCL ER (XL) 300 MG PO TB24: 300.0000 mg | ORAL_TABLET | Freq: Every day | ORAL | 0 refills | Status: DC

## 2023-03-18 MED ORDER — AMPHETAMINE-DEXTROAMPHET ER 30 MG PO CP24
30.0000 mg | ORAL_CAPSULE | ORAL | 0 refills | Status: DC
Start: 2023-05-17 — End: 2023-09-16

## 2023-03-18 NOTE — Progress Notes (Signed)
Established Patient Office Visit  Subjective   Patient ID: Michael Booker, male    DOB: 07-22-1990  Age: 32 y.o. MRN: [id number]  Chief Complaint  Patient presents with   Medical Management of Chronic Issues    mood    HPI Pt is a 32 yo male with ADHD, MDD, GAD who presents to the clinic for follow up and med refills. He is doing great. No concerns. Denies any problems sleeping or with mood.   .. Active Ambulatory Problems    Diagnosis Date Noted   Attention deficit hyperactivity disorder (ADHD), predominantly inattentive type 06/02/2017   Childhood asthma 11/20/2018   Depressed mood 05/05/2019   Anxiety 05/05/2019   Stress at home 05/05/2019   Decreased libido 11/15/2019   Excessive daytime sleepiness 11/15/2019   No energy 11/15/2019   Scoliosis of thoracolumbar spine 09/04/2020   Chronic mid back pain 09/04/2020   Resolved Ambulatory Problems    Diagnosis Date Noted   No Resolved Ambulatory Problems   No Additional Past Medical History    Review of Systems  All other systems reviewed and are negative.     Objective:     BP 120/80   Pulse 96   Ht 5\' 8"  (1.727 m)   Wt 192 lb (87.1 kg)   SpO2 99%   BMI 29.19 kg/m  BP Readings from Last 3 Encounters:  03/18/23 120/80  12/16/22 114/73  06/14/22 120/78   Wt Readings from Last 3 Encounters:  03/18/23 192 lb (87.1 kg)  12/16/22 196 lb (88.9 kg)  06/14/22 199 lb (90.3 kg)    ..    03/18/2023    8:55 AM 12/16/2022    2:17 PM 06/14/2022   11:20 AM 12/12/2021    3:57 PM 12/12/2021    3:28 PM  Depression screen PHQ 2/9  Decreased Interest 1 1 1  0 0  Down, Depressed, Hopeless 1 0 0 0 0  PHQ - 2 Score 2 1 1  0 0  Altered sleeping 0 1 0 1   Tired, decreased energy 1 1 1 1    Change in appetite 0 0 1 0   Feeling bad or failure about yourself  0 0 0 0   Trouble concentrating 1 0 0 1   Moving slowly or fidgety/restless 0 0 0 0   Suicidal thoughts 0 0 0 0   PHQ-9 Score 4 3 3 3    Difficult doing work/chores Not  difficult at all Not difficult at all Not difficult at all Not difficult at all    ..    03/18/2023    8:56 AM 12/16/2022    2:18 PM 06/14/2022   11:26 AM 12/12/2021    3:57 PM  GAD 7 : Generalized Anxiety Score  Nervous, Anxious, on Edge 0 0 0 0  Control/stop worrying 0 0 0 0  Worry too much - different things 0 0 0 1  Trouble relaxing 0 0 0 0  Restless 0 0 0 0  Easily annoyed or irritable 0 0 0 0  Afraid - awful might happen 1 0 0 0  Total GAD 7 Score 1 0 0 1  Anxiety Difficulty Not difficult at all Not difficult at all Not difficult at all Not difficult at all      Physical Exam Constitutional:      Appearance: Normal appearance.  HENT:     Head: Normocephalic.  Cardiovascular:     Rate and Rhythm: Normal rate and regular rhythm.  Pulses: Normal pulses.     Heart sounds: Normal heart sounds.  Pulmonary:     Effort: Pulmonary effort is normal.     Breath sounds: Normal breath sounds.  Neurological:     General: No focal deficit present.     Mental Status: He is alert and oriented to person, place, and time.  Psychiatric:        Mood and Affect: Mood normal.         Assessment & Plan:  [name] KitchenMarland KitchenYani was seen today for medical management of chronic issues.  Diagnoses and all orders for this visit:  Attention deficit hyperactivity disorder (ADHD), predominantly inattentive type -     buPROPion (WELLBUTRIN XL) 300 MG 24 hr tablet; Take 1 tablet (300 mg total) by mouth daily. -     amphetamine-dextroamphetamine (ADDERALL XR) 30 MG 24 hr capsule; Take 1 capsule (30 mg total) by mouth every morning. -     amphetamine-dextroamphetamine (ADDERALL XR) 30 MG 24 hr capsule; Take 1 capsule (30 mg total) by mouth every morning. -     amphetamine-dextroamphetamine (ADDERALL XR) 30 MG 24 hr capsule; Take 1 capsule (30 mg total) by mouth every morning. -     amphetamine-dextroamphetamine (ADDERALL) 20 MG tablet; Take one tablet daily at 2pm. -     amphetamine-dextroamphetamine  (ADDERALL) 20 MG tablet; Take one tablet daily at 2pm. -     amphetamine-dextroamphetamine (ADDERALL) 20 MG tablet; Take one tablet daily at 2pm.  Depressed mood -     buPROPion (WELLBUTRIN XL) 300 MG 24 hr tablet; Take 1 tablet (300 mg total) by mouth daily.  Anxiety -     buPROPion (WELLBUTRIN XL) 300 MG 24 hr tablet; Take 1 tablet (300 mg total) by mouth daily.   PHQ/GAD no concerns Wellbutrin and Adderall refilled Vitals look good Ok to follow up in 6 months .[name] KitchenPDMP reviewed during this encounter.     Tandy Gaw, PA-C

## 2023-05-09 ENCOUNTER — Other Ambulatory Visit: Payer: Self-pay | Admitting: Physician Assistant

## 2023-05-09 DIAGNOSIS — F900 Attention-deficit hyperactivity disorder, predominantly inattentive type: Secondary | ICD-10-CM

## 2023-05-09 DIAGNOSIS — R4589 Other symptoms and signs involving emotional state: Secondary | ICD-10-CM

## 2023-05-09 DIAGNOSIS — F419 Anxiety disorder, unspecified: Secondary | ICD-10-CM

## 2023-08-07 ENCOUNTER — Other Ambulatory Visit: Payer: Self-pay | Admitting: Physician Assistant

## 2023-08-07 DIAGNOSIS — F900 Attention-deficit hyperactivity disorder, predominantly inattentive type: Secondary | ICD-10-CM

## 2023-08-08 NOTE — Telephone Encounter (Signed)
 Requesting rx rf of  Adderall XR 30mg   Last written 05/17/2023 Last OV 03/18/2023 Upcoming appt 09/16/2023

## 2023-08-11 MED ORDER — AMPHETAMINE-DEXTROAMPHET ER 30 MG PO CP24: 30.0000 mg | ORAL_CAPSULE | ORAL | 0 refills | Status: DC

## 2023-08-13 ENCOUNTER — Other Ambulatory Visit: Payer: Self-pay | Admitting: Physician Assistant

## 2023-08-13 DIAGNOSIS — F900 Attention-deficit hyperactivity disorder, predominantly inattentive type: Secondary | ICD-10-CM

## 2023-08-13 NOTE — Telephone Encounter (Signed)
 Spoke with pharmacy  Adderall XR 30mg  is ready for pick up  Adderall 20mg  was picked up off of an old prescription dated from Oct 2024.

## 2023-09-16 ENCOUNTER — Encounter: Payer: Self-pay | Admitting: Physician Assistant

## 2023-09-16 ENCOUNTER — Ambulatory Visit: Payer: Managed Care, Other (non HMO) | Admitting: Physician Assistant

## 2023-09-16 VITALS — BP 127/86 | HR 96 | Ht 68.0 in | Wt 193.0 lb

## 2023-09-16 DIAGNOSIS — F900 Attention-deficit hyperactivity disorder, predominantly inattentive type: Secondary | ICD-10-CM | POA: Diagnosis not present

## 2023-09-16 DIAGNOSIS — R4589 Other symptoms and signs involving emotional state: Secondary | ICD-10-CM | POA: Diagnosis not present

## 2023-09-16 DIAGNOSIS — F419 Anxiety disorder, unspecified: Secondary | ICD-10-CM

## 2023-09-16 MED ORDER — AMPHETAMINE-DEXTROAMPHETAMINE 20 MG PO TABS: ORAL_TABLET | ORAL | 0 refills | Status: DC

## 2023-09-16 MED ORDER — AMPHETAMINE-DEXTROAMPHET ER 30 MG PO CP24: 30.0000 mg | ORAL_CAPSULE | ORAL | 0 refills | Status: AC

## 2023-09-16 MED ORDER — AMPHETAMINE-DEXTROAMPHET ER 30 MG PO CP24: 30.0000 mg | ORAL_CAPSULE | ORAL | 0 refills | Status: DC

## 2023-09-16 MED ORDER — BUPROPION HCL ER (XL) 300 MG PO TB24: 300.0000 mg | ORAL_TABLET | Freq: Every day | ORAL | 3 refills | Status: AC

## 2023-09-17 ENCOUNTER — Encounter: Payer: Self-pay | Admitting: Physician Assistant

## 2023-09-17 NOTE — Progress Notes (Signed)
 Established Patient Office Visit  Subjective   Patient ID: Michael Booker, male    DOB: May 06, 1991  Age: 33 y.o. MRN: [id number]  Chief Complaint  Patient presents with   Medical Management of Chronic Issues    ADHD fup    HPI Pt is a 33 yo male with ADHD, MDD, GAD who presents to the clinic for 6 month follow up. Pt is doing well. He denies any concerning anxiety or mood issues. He is doing well at work. He is sleeping well. He is compliant with medications and needs refills.     Review of Systems  All other systems reviewed and are negative.     Objective:     BP 127/86   Pulse 96   Ht 5\' 8"  (1.727 m)   Wt 193 lb (87.5 kg)   SpO2 99%   BMI 29.35 kg/m  BP Readings from Last 3 Encounters:  09/16/23 127/86  03/18/23 120/80  12/16/22 114/73   Wt Readings from Last 3 Encounters:  09/16/23 193 lb (87.5 kg)  03/18/23 192 lb (87.1 kg)  12/16/22 196 lb (88.9 kg)    ..    09/16/2023    9:15 AM 03/18/2023    8:55 AM 12/16/2022    2:17 PM 06/14/2022   11:20 AM 12/12/2021    3:57 PM  Depression screen PHQ 2/9  Decreased Interest 1 1 1 1  0  Down, Depressed, Hopeless 0 1 0 0 0  PHQ - 2 Score 1 2 1 1  0  Altered sleeping 0 0 1 0 1  Tired, decreased energy 1 1 1 1 1   Change in appetite 0 0 0 1 0  Feeling bad or failure about yourself  0 0 0 0 0  Trouble concentrating 1 1 0 0 1  Moving slowly or fidgety/restless 0 0 0 0 0  Suicidal thoughts 0 0 0 0 0  PHQ-9 Score 3 4 3 3 3   Difficult doing work/chores Not difficult at all Not difficult at all Not difficult at all Not difficult at all Not difficult at all   ..    09/16/2023    9:16 AM 03/18/2023    8:56 AM 12/16/2022    2:18 PM 06/14/2022   11:26 AM  GAD 7 : Generalized Anxiety Score  Nervous, Anxious, on Edge 1 0 0 0  Control/stop worrying 1 0 0 0  Worry too much - different things 2 0 0 0  Trouble relaxing 1 0 0 0  Restless 0 0 0 0  Easily annoyed or irritable 0 0 0 0  Afraid - awful might happen 0 1 0 0  Total  GAD 7 Score 5 1 0 0  Anxiety Difficulty Not difficult at all Not difficult at all Not difficult at all Not difficult at all      Physical Exam Constitutional:      Appearance: Normal appearance.  HENT:     Head: Normocephalic.  Cardiovascular:     Rate and Rhythm: Normal rate and regular rhythm.     Heart sounds: Normal heart sounds. No murmur heard. Pulmonary:     Effort: Pulmonary effort is normal.     Breath sounds: Normal breath sounds.  Neurological:     General: No focal deficit present.     Mental Status: He is alert.  Psychiatric:        Mood and Affect: Mood normal.       Assessment & Plan:  Michael Booker was seen today for  medical management of chronic issues.  Diagnoses and all orders for this visit:  Attention deficit hyperactivity disorder (ADHD), predominantly inattentive type -     buPROPion  (WELLBUTRIN  XL) 300 MG 24 hr tablet; Take 1 tablet (300 mg total) by mouth daily. -     amphetamine -dextroamphetamine  (ADDERALL XR) 30 MG 24 hr capsule; Take 1 capsule (30 mg total) by mouth every morning. -     amphetamine -dextroamphetamine  (ADDERALL XR) 30 MG 24 hr capsule; Take 1 capsule (30 mg total) by mouth every morning. -     amphetamine -dextroamphetamine  (ADDERALL) 20 MG tablet; Take one tablet daily at 2pm. -     amphetamine -dextroamphetamine  (ADDERALL) 20 MG tablet; Take one tablet at 2pm daily. -     amphetamine -dextroamphetamine  (ADDERALL XR) 30 MG 24 hr capsule; Take 1 capsule (30 mg total) by mouth every morning. -     amphetamine -dextroamphetamine  (ADDERALL) 20 MG tablet; Take one tablet daily at 2pm.  Depressed mood -     buPROPion  (WELLBUTRIN  XL) 300 MG 24 hr tablet; Take 1 tablet (300 mg total) by mouth daily.  Anxiety -     buPROPion  (WELLBUTRIN  XL) 300 MG 24 hr tablet; Take 1 tablet (300 mg total) by mouth daily.   PHQ/GAD to goal and patient has no concerns Refilled adderall and wellbutrin  Pt needs labs and states he gets them at work and will bring  in copy at next visit    Return in about 6 months (around 03/17/2024).    Briar Witherspoon, PA-C

## 2024-02-09 ENCOUNTER — Encounter: Payer: Self-pay | Admitting: Physician Assistant

## 2024-02-09 DIAGNOSIS — F900 Attention-deficit hyperactivity disorder, predominantly inattentive type: Secondary | ICD-10-CM

## 2024-02-10 MED ORDER — AMPHETAMINE-DEXTROAMPHET ER 30 MG PO CP24
30.0000 mg | ORAL_CAPSULE | ORAL | 0 refills | Status: DC
Start: 2024-03-11 — End: 2024-03-18

## 2024-02-10 MED ORDER — AMPHETAMINE-DEXTROAMPHET ER 30 MG PO CP24: 30.0000 mg | ORAL_CAPSULE | ORAL | 0 refills | Status: AC

## 2024-02-10 MED ORDER — AMPHETAMINE-DEXTROAMPHETAMINE 20 MG PO TABS: ORAL_TABLET | ORAL | 0 refills | Status: AC

## 2024-02-10 MED ORDER — AMPHETAMINE-DEXTROAMPHET ER 30 MG PO CP24
30.0000 mg | ORAL_CAPSULE | ORAL | 0 refills | Status: DC
Start: 2024-02-10 — End: 2024-03-18

## 2024-02-10 MED ORDER — AMPHETAMINE-DEXTROAMPHETAMINE 20 MG PO TABS
ORAL_TABLET | ORAL | 0 refills | Status: DC
Start: 2024-02-10 — End: 2024-03-18

## 2024-02-10 NOTE — Telephone Encounter (Signed)
 Requesting rx rf of  Adderall XR 20  Last written 11/15/2023 And  Adderall XR 30mg   Last written 11/15/2023 Last OV 09/16/2023 Upcoming appt 03/17/2024

## 2024-03-17 ENCOUNTER — Ambulatory Visit: Admitting: Physician Assistant

## 2024-03-17 VITALS — BP 132/78 | HR 85 | Ht 68.0 in | Wt 193.0 lb

## 2024-03-17 DIAGNOSIS — F419 Anxiety disorder, unspecified: Secondary | ICD-10-CM

## 2024-03-17 DIAGNOSIS — Z131 Encounter for screening for diabetes mellitus: Secondary | ICD-10-CM | POA: Diagnosis not present

## 2024-03-17 DIAGNOSIS — R4589 Other symptoms and signs involving emotional state: Secondary | ICD-10-CM | POA: Diagnosis not present

## 2024-03-17 DIAGNOSIS — F900 Attention-deficit hyperactivity disorder, predominantly inattentive type: Secondary | ICD-10-CM

## 2024-03-17 DIAGNOSIS — Z13220 Encounter for screening for lipoid disorders: Secondary | ICD-10-CM

## 2024-03-17 NOTE — Progress Notes (Unsigned)
   Established Patient Office Visit  Subjective   Patient ID: Michael Booker, male    DOB: Aug 31, 1990  Age: 33 y.o. MRN: [id number]  Chief Complaint  Patient presents with   Medical Management of Chronic Issues    Patient is a 33 yo male presenting for a follow-up appointment regarding medication management. He currently takes Adderall (IR and XR) for ADHD and Wellbutrin  XL for mood. He states everything is going well with no adverse effects to his medications. HE is fasting today for blood work to be done.   {History (Optional):23778}  Review of Systems  Respiratory: Negative.    Cardiovascular: Negative.   Gastrointestinal: Negative.   Psychiatric/Behavioral:  Negative for depression and suicidal ideas. The patient is not nervous/anxious.   All other systems reviewed and are negative.     Objective:     BP 132/78   Pulse 85   Ht 5' 8 (1.727 m)   Wt 87.5 kg   SpO2 99%   BMI 29.35 kg/m  {Vitals History (Optional):23777}  Physical Exam Constitutional:      Appearance: Normal appearance. He is normal weight.  HENT:     Head: Normocephalic and atraumatic.  Cardiovascular:     Rate and Rhythm: Normal rate and regular rhythm.     Heart sounds: Normal heart sounds.  Pulmonary:     Effort: Pulmonary effort is normal.     Breath sounds: Normal breath sounds.  Neurological:     General: No focal deficit present.     Mental Status: He is alert and oriented to person, place, and time.  Psychiatric:        Mood and Affect: Mood normal.        Behavior: Behavior normal.        Thought Content: Thought content normal.        Judgment: Judgment normal.      No results found for any visits on 03/17/24.  {Labs (Optional):23779}  The ASCVD Risk score (Arnett DK, et al., 2019) failed to calculate for the following reasons:   The 2019 ASCVD risk score is only valid for ages 9 to 48    Assessment & Plan:   Problem List Items Addressed This Visit       Other    Attention deficit hyperactivity disorder (ADHD), predominantly inattentive type - Primary   Depressed mood   Relevant Orders   CBC with Differential/Platelet   TSH   Vitamin B12   VITAMIN D 25 Hydroxy (Vit-D Deficiency, Fractures)      Anxiety   Relevant Orders   CBC with Differential/Platelet   TSH   Vitamin B12   VITAMIN D 25 Hydroxy (Vit-D Deficiency, Fractures)   Other Visit Diagnoses       Screening for diabetes mellitus       Relevant Orders   CMP14+EGFR     Screening for lipid disorders       Relevant Orders   Lipid panel  PHQ/GAD are good with no concerns from the patient Continue taking Adderall for ADHD and Wellbutrin  for Mood Refilled medications as well  Review MyChart for labs taken today If symptoms or mood gets worse, please return.      Return in about 6 months (around 09/15/2024).    Destine K Riggins, Student-PA

## 2024-03-18 ENCOUNTER — Encounter: Payer: Self-pay | Admitting: Physician Assistant

## 2024-03-18 ENCOUNTER — Ambulatory Visit: Payer: Self-pay | Admitting: Physician Assistant

## 2024-03-18 LAB — CMP14+EGFR
ALT: 19 IU/L (ref 0–44)
AST: 17 IU/L (ref 0–40)
Albumin: 4.6 g/dL (ref 4.1–5.1)
Alkaline Phosphatase: 58 IU/L (ref 47–123)
BUN/Creatinine Ratio: 13 (ref 9–20)
BUN: 15 mg/dL (ref 6–20)
Bilirubin Total: 0.6 mg/dL (ref 0.0–1.2)
CO2: 24 mmol/L (ref 20–29)
Calcium: 9.6 mg/dL (ref 8.7–10.2)
Chloride: 102 mmol/L (ref 96–106)
Creatinine, Ser: 1.12 mg/dL (ref 0.76–1.27)
Globulin, Total: 1.9 g/dL (ref 1.5–4.5)
Glucose: 95 mg/dL (ref 70–99)
Potassium: 4.2 mmol/L (ref 3.5–5.2)
Sodium: 141 mmol/L (ref 134–144)
Total Protein: 6.5 g/dL (ref 6.0–8.5)
eGFR: 88 mL/min/1.73 (ref >59)

## 2024-03-18 LAB — CBC WITH DIFFERENTIAL/PLATELET
Basophils Absolute: 0.1 x10E3/uL (ref 0.0–0.2)
Basos: 2 %
EOS (ABSOLUTE): 0.6 x10E3/uL — ABNORMAL HIGH (ref 0.0–0.4)
Eos: 9 %
Hematocrit: 46.8 % (ref 37.5–51.0)
Hemoglobin: 15.5 g/dL (ref 13.0–17.7)
Immature Grans (Abs): 0 x10E3/uL (ref 0.0–0.1)
Immature Granulocytes: 0 %
Lymphocytes Absolute: 2.3 x10E3/uL (ref 0.7–3.1)
Lymphs: 36 %
MCH: 30.5 pg (ref 26.6–33.0)
MCHC: 33.1 g/dL (ref 31.5–35.7)
MCV: 92 fL (ref 79–97)
Monocytes Absolute: 0.5 x10E3/uL (ref 0.1–0.9)
Monocytes: 8 %
Neutrophils Absolute: 2.9 x10E3/uL (ref 1.4–7.0)
Neutrophils: 45 %
Platelets: 223 x10E3/uL (ref 150–450)
RBC: 5.08 x10E6/uL (ref 4.14–5.80)
RDW: 12.1 % (ref 11.6–15.4)
WBC: 6.4 x10E3/uL (ref 3.4–10.8)

## 2024-03-18 LAB — LIPID PANEL
Chol/HDL Ratio: 3.2 ratio (ref 0.0–5.0)
Cholesterol, Total: 142 mg/dL (ref 100–199)
HDL: 44 mg/dL (ref >39)
LDL Chol Calc (NIH): 84 mg/dL (ref 0–99)
Triglycerides: 70 mg/dL (ref 0–149)
VLDL Cholesterol Cal: 14 mg/dL (ref 5–40)

## 2024-03-18 LAB — TSH: TSH: 1.33 u[IU]/mL (ref 0.450–4.500)

## 2024-03-18 LAB — VITAMIN D 25 HYDROXY (VIT D DEFICIENCY, FRACTURES): Vit D, 25-Hydroxy: 40.8 ng/mL (ref 30.0–100.0)

## 2024-03-18 LAB — VITAMIN B12: Vitamin B-12: 572 pg/mL (ref 232–1245)

## 2024-03-18 MED ORDER — AMPHETAMINE-DEXTROAMPHETAMINE 20 MG PO TABS: ORAL_TABLET | ORAL | 0 refills | Status: AC

## 2024-03-18 MED ORDER — AMPHETAMINE-DEXTROAMPHET ER 30 MG PO CP24: 30.0000 mg | ORAL_CAPSULE | ORAL | 0 refills | Status: AC

## 2024-03-18 NOTE — Progress Notes (Signed)
 Michael Booker,   Labs look good.  Cholesterol is better!  Vitamin B12 and vitamin D look great.

## 2024-09-15 ENCOUNTER — Ambulatory Visit: Admitting: Physician Assistant
